# Patient Record
Sex: Male | Born: 1960 | Race: Black or African American | Hispanic: No | Marital: Single | Smoking: Current every day smoker
Health system: Southern US, Community
[De-identification: ages and names within clinical notes are randomized; demographics above are authoritative.]

## PROBLEM LIST (undated history)

## (undated) DIAGNOSIS — K3580 Unspecified acute appendicitis: Secondary | ICD-10-CM

## (undated) DIAGNOSIS — I10 Essential (primary) hypertension: Secondary | ICD-10-CM

## (undated) DIAGNOSIS — K37 Unspecified appendicitis: Secondary | ICD-10-CM

## (undated) DIAGNOSIS — Z720 Tobacco use: Secondary | ICD-10-CM

## (undated) DIAGNOSIS — K219 Gastro-esophageal reflux disease without esophagitis: Secondary | ICD-10-CM

## (undated) DIAGNOSIS — B1920 Unspecified viral hepatitis C without hepatic coma: Secondary | ICD-10-CM

## (undated) HISTORY — DX: Tobacco use: Z72.0

## (undated) HISTORY — DX: Gastro-esophageal reflux disease without esophagitis: K21.9

## (undated) HISTORY — DX: Unspecified acute appendicitis: K35.80

## (undated) HISTORY — DX: Unspecified viral hepatitis C without hepatic coma: B19.20

## (undated) HISTORY — PX: HERNIA REPAIR: SHX51

## (undated) HISTORY — DX: Essential (primary) hypertension: I10

## (undated) HISTORY — DX: Unspecified appendicitis: K37

---

## 2003-12-28 ENCOUNTER — Emergency Department: Payer: Self-pay | Admitting: Emergency Medicine

## 2003-12-28 ENCOUNTER — Other Ambulatory Visit: Payer: Self-pay

## 2004-01-02 ENCOUNTER — Emergency Department: Payer: Self-pay | Admitting: Unknown Physician Specialty

## 2005-06-29 ENCOUNTER — Emergency Department: Payer: Self-pay | Admitting: Emergency Medicine

## 2005-07-05 ENCOUNTER — Ambulatory Visit: Payer: Self-pay | Admitting: Emergency Medicine

## 2005-07-15 ENCOUNTER — Ambulatory Visit: Payer: Self-pay | Admitting: Internal Medicine

## 2006-01-12 ENCOUNTER — Other Ambulatory Visit: Payer: Self-pay

## 2006-01-12 ENCOUNTER — Emergency Department: Payer: Self-pay | Admitting: Emergency Medicine

## 2006-09-19 ENCOUNTER — Emergency Department: Payer: Self-pay | Admitting: Emergency Medicine

## 2006-10-16 ENCOUNTER — Other Ambulatory Visit: Payer: Self-pay

## 2006-10-16 ENCOUNTER — Emergency Department: Payer: Self-pay | Admitting: Emergency Medicine

## 2006-10-20 ENCOUNTER — Emergency Department: Payer: Self-pay | Admitting: Emergency Medicine

## 2006-10-21 ENCOUNTER — Other Ambulatory Visit: Payer: Self-pay

## 2006-12-19 ENCOUNTER — Other Ambulatory Visit: Payer: Self-pay

## 2006-12-19 ENCOUNTER — Emergency Department: Payer: Self-pay | Admitting: Emergency Medicine

## 2008-04-24 ENCOUNTER — Emergency Department: Payer: Self-pay | Admitting: Emergency Medicine

## 2008-07-11 ENCOUNTER — Emergency Department: Payer: Self-pay | Admitting: Emergency Medicine

## 2008-07-11 ENCOUNTER — Ambulatory Visit: Payer: Self-pay

## 2013-05-01 LAB — BASIC METABOLIC PANEL
BUN: 14 mg/dL (ref 4–21)
Creatinine: 0.9 mg/dL (ref 0.6–1.3)
Glucose: 89 mg/dL
POTASSIUM: 4 mmol/L (ref 3.4–5.3)
SODIUM: 141 mmol/L (ref 137–147)

## 2013-05-01 LAB — TSH: TSH: 1.83 u[IU]/mL (ref 0.41–5.90)

## 2013-05-01 LAB — HEPATIC FUNCTION PANEL
ALT: 23 U/L (ref 10–40)
AST: 25 U/L (ref 14–40)
Alkaline Phosphatase: 57 U/L (ref 25–125)
BILIRUBIN, TOTAL: 0.4 mg/dL

## 2013-05-01 LAB — CBC AND DIFFERENTIAL
HCT: 45 % (ref 41–53)
Hemoglobin: 15.7 g/dL (ref 13.5–17.5)
NEUTROS ABS: 2 /uL
Platelets: 240 10*3/uL (ref 150–399)
WBC: 4.2 10^3/mL

## 2013-05-01 LAB — LIPID PANEL
Cholesterol: 151 mg/dL (ref 0–200)
HDL: 41 mg/dL (ref 35–70)
LDL CALC: 92 mg/dL
TRIGLYCERIDES: 88 mg/dL (ref 40–160)

## 2013-05-01 LAB — HEMOGLOBIN A1C: HEMOGLOBIN A1C: 5.7

## 2013-07-18 ENCOUNTER — Emergency Department: Payer: Self-pay | Admitting: Emergency Medicine

## 2013-07-18 LAB — CBC
HCT: 48.8 % (ref 40.0–52.0)
HGB: 16.8 g/dL (ref 13.0–18.0)
MCH: 33.7 pg (ref 26.0–34.0)
MCHC: 34.4 g/dL (ref 32.0–36.0)
MCV: 98 fL (ref 80–100)
PLATELETS: 240 10*3/uL (ref 150–440)
RBC: 4.98 10*6/uL (ref 4.40–5.90)
RDW: 13.3 % (ref 11.5–14.5)
WBC: 5.2 10*3/uL (ref 3.8–10.6)

## 2013-07-18 LAB — COMPREHENSIVE METABOLIC PANEL
ALBUMIN: 3.8 g/dL (ref 3.4–5.0)
ANION GAP: 3 — AB (ref 7–16)
AST: 19 U/L (ref 15–37)
Alkaline Phosphatase: 64 U/L
BUN: 10 mg/dL (ref 7–18)
Bilirubin,Total: 0.6 mg/dL (ref 0.2–1.0)
CO2: 33 mmol/L — AB (ref 21–32)
CREATININE: 0.84 mg/dL (ref 0.60–1.30)
Calcium, Total: 9 mg/dL (ref 8.5–10.1)
Chloride: 102 mmol/L (ref 98–107)
EGFR (African American): >60
EGFR (Non-African Amer.): >60
Glucose: 112 mg/dL — ABNORMAL HIGH (ref 65–99)
Osmolality: 275 (ref 275–301)
Potassium: 3.5 mmol/L (ref 3.5–5.1)
SGPT (ALT): 24 U/L (ref 12–78)
Sodium: 138 mmol/L (ref 136–145)
Total Protein: 8.9 g/dL — ABNORMAL HIGH (ref 6.4–8.2)

## 2013-07-18 LAB — URINALYSIS, COMPLETE
BACTERIA: NONE SEEN
BILIRUBIN, UR: NEGATIVE
Blood: NEGATIVE
Glucose,UR: NEGATIVE mg/dL (ref 0–75)
Ketone: NEGATIVE
Leukocyte Esterase: NEGATIVE
NITRITE: NEGATIVE
PROTEIN: NEGATIVE
Ph: 5 (ref 4.5–8.0)
Specific Gravity: 1.02 (ref 1.003–1.030)
Squamous Epithelial: <1
WBC UR: <1 /HPF (ref 0–5)

## 2013-07-18 LAB — GC/CHLAMYDIA PROBE AMP

## 2014-12-20 ENCOUNTER — Emergency Department
Admission: EM | Admit: 2014-12-20 | Discharge: 2014-12-20 | Disposition: A | Discharge status: Home / Self Care | Payer: Medicaid Other | Attending: Student | Admitting: Student

## 2014-12-20 ENCOUNTER — Encounter: Payer: Self-pay | Admitting: Emergency Medicine

## 2014-12-20 DIAGNOSIS — K0253 Dental caries on pit and fissure surface penetrating into pulp: Secondary | ICD-10-CM | POA: Insufficient documentation

## 2014-12-20 DIAGNOSIS — Z720 Tobacco use: Secondary | ICD-10-CM | POA: Insufficient documentation

## 2014-12-20 DIAGNOSIS — K0381 Cracked tooth: Secondary | ICD-10-CM | POA: Diagnosis not present

## 2014-12-20 DIAGNOSIS — S025XXA Fracture of tooth (traumatic), initial encounter for closed fracture: Secondary | ICD-10-CM

## 2014-12-20 DIAGNOSIS — K029 Dental caries, unspecified: Secondary | ICD-10-CM

## 2014-12-20 DIAGNOSIS — K0889 Other specified disorders of teeth and supporting structures: Secondary | ICD-10-CM | POA: Diagnosis present

## 2014-12-20 MED ORDER — PENICILLIN V POTASSIUM 500 MG PO TABS: 500.0000 mg | ORAL_TABLET | Freq: Four times a day (QID) | ORAL | Status: DC

## 2014-12-20 NOTE — ED Provider Notes (Signed)
[facility] Emergency Department Provider Note ____________________________________________  Time seen: 1400  I have reviewed the triage vital signs and the nursing notes.  HISTORY  Chief Complaint  Dental Injury  HPI Shane Dominguez is a 54 y.o. male reports ED for evaluation and management of a acute fracture to her chronically decayed to in the maxilla. He describes yesterday, and his front right incisor broke without injury. He denies fevers, chills, or sweats. He rates his pain at 3/10.   History reviewed. No pertinent past medical history.  There are no active problems to display for this patient.  Past Surgical History  Procedure Laterality Date  . Hernia repair      Current Outpatient Rx  Name  Route  Sig  Dispense  Refill  . penicillin v potassium (VEETID) 500 MG tablet   Oral   Take 1 tablet (500 mg total) by mouth 4 (four) times daily.   40 tablet   0    Allergies Review of patient's allergies indicates no known allergies.  No family history on file.  Social History Social History  Substance Use Topics  . Smoking status: Current Every Day Smoker  . Smokeless tobacco: None  . Alcohol Use: No   Review of Systems  Constitutional: Negative for fever. Eyes: Negative for visual changes. ENT: Negative for sore throat. Dental injury as above Cardiovascular: Negative for chest pain. Respiratory: Negative for shortness of breath. Gastrointestinal: Negative for abdominal pain, vomiting and diarrhea. Genitourinary: Negative for dysuria. Musculoskeletal: Negative for back pain. Skin: Negative for rash. Neurological: Negative for headaches, focal weakness or numbness. ____________________________________________  PHYSICAL EXAM:  VITAL SIGNS: ED Triage Vitals  Enc Vitals Group     BP 12/20/14 1301 161/89 mmHg     Pulse Rate 12/20/14 1301 78     Resp 12/20/14 1301 16     Temp 12/20/14 1301 98.4 F (36.9 C)     Temp Source 12/20/14  1301 Oral     SpO2 12/20/14 1301 100 %     Weight 12/20/14 1301 145 lb (65.772 kg)     Height 12/20/14 1301 6' (1.829 m)     Head Cir --      Peak Flow --      Pain Score 12/20/14 1257 3     Pain Loc --      Pain Edu? --      Excl. in GC? --    Constitutional: Alert and oriented. Well appearing and in no distress. Head: Normocephalic and atraumatic.      Eyes: Conjunctivae are normal. PERRL. Normal extraocular movements      Ears: Canals clear. TMs intact bilaterally.   Nose: No congestion/rhinorrhea.   Mouth/Throat: Mucous membranes are moist. General dental caries. Front right incisor broken to the gum. No appreciable dental abscess.    Neck: Supple. No thyromegaly. Hematological/Lymphatic/Immunological: No cervical lymphadenopathy. Cardiovascular: Normal rate, regular rhythm.  Respiratory: Normal respiratory effort. No wheezes/rales/rhonchi. Gastrointestinal: Soft and nontender. No distention. Musculoskeletal: Nontender with normal range of motion in all extremities.  Neurologic:  Normal gait without ataxia. Normal speech and language. No gross focal neurologic deficits are appreciated. Skin:  Skin is warm, dry and intact. No rash noted. Psychiatric: Mood and affect are normal. Patient exhibits appropriate insight and judgment. 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 Patient given list of dental providers. Prescription for Pen VK for given prophylactically.  ____________________________________________  FINAL CLINICAL IMPRESSION(S) / ED DIAGNOSES  Final diagnoses:  Dental caries into pulp  Broken tooth, closed, initial encounter      Lissa HoardJenise V Bacon Wilfrid Hyser, PA-C 12/20/14 1507  Gayla DossEryka A Gayle, MD 12/20/14 1531

## 2014-12-20 NOTE — ED Notes (Signed)
Patient to ER for broken tooth. States he knows there is no dentist to see him here, but wants to be assessed so that he can get referral to dentist with Medicaid. Broken tooth is right upper front dentition.

## 2014-12-20 NOTE — Discharge Instructions (Signed)
Dental Caries Dental caries is tooth decay. This decay can cause a hole in teeth (cavity) that can get bigger and deeper over time. HOME CARE  Brush and floss your teeth. Do this at least two times a day.  Use a fluoride toothpaste.  Use a mouth rinse if told by your dentist or doctor.  Eat less sugary and starchy foods. Drink less sugary drinks.  Avoid snacking often on sugary and starchy foods. Avoid sipping often on sugary drinks.  Keep regular checkups and cleanings with your dentist.  Use fluoride supplements if told by your dentist or doctor.  Allow fluoride to be applied to teeth if told by your dentist or doctor.   This information is not intended to replace advice given to you by your health care provider. Make sure you discuss any questions you have with your health care provider.   Document Released: 12/01/2007 Document Revised: 03/14/2014 Document Reviewed: 02/24/2012 Elsevier Interactive Patient Education 2016 Elsevier Inc.  Dental Pain Dental pain may be caused by many things, including:  Tooth decay (cavities or caries). Cavities cause the nerve of your tooth to be open to air and hot or cold temperatures. This can cause pain or discomfort.  Abscess or infection. A dental abscess is an area that is full of infected pus from a bacterial infection in the inner part of the tooth (pulp). It usually happens at the end of the tooth's root.  Injury.  An unknown reason (idiopathic). Your pain may be mild or severe. It may only happen when:  You are chewing.  You are exposed to hot or cold temperature.  You are eating or drinking sugary foods or beverages, such as:  Soda.  Candy. Your pain may also be there all of the time. HOME CARE Watch your dental pain for any changes. Do these things to lessen your discomfort:  Take medicines only as told by your dentist.  If your dentist tells you to take an antibiotic medicine, finish all of it even if you start to  feel better.  Keep all follow-up visits as told by your dentist. This is important.  Do not apply heat to the outside of your face.  Rinse your mouth or gargle with salt water if told by your dentist. This helps with pain and swelling.  You can make salt water by adding  tsp of salt to 1 cup of warm water.  Apply ice to the painful area of your face:  Put ice in a plastic bag.  Place a towel between your skin and the bag.  Leave the ice on for 20 minutes, 2-3 times per day.  Avoid foods or drinks that cause you pain, such as:  Very hot or very cold foods or drinks.  Sweet or sugary foods or drinks. GET HELP IF:  Your pain is not helped with medicines.  Your symptoms are worse.  You have new symptoms. GET HELP RIGHT AWAY IF:  You cannot open your mouth.  You are having trouble breathing or swallowing.  You have a fever.  Your face, neck, or jaw is puffy (swollen).   This information is not intended to replace advice given to you by your health care provider. Make sure you discuss any questions you have with your health care provider.   Document Released: 08/10/2007 Document Revised: 07/08/2014 Document Reviewed: 02/17/2014 Elsevier Interactive Patient Education 2016 [employer]  OPTIONS FOR DENTAL FOLLOW UP CARE  [place] Department of Health and Human Services - Local  Safety Net Dental Clinics [email or web address]   [facility] [phone])  [facility] Ma [phone])  [facility] [phone])  [facility] [phone])  [facility] [phone]) This clinic caters to the indigent population and is on a lottery system. Location: [employer] [facility], [employer], [address] [place] Clinic Hours: Wednesdays from 6pm - 9pm, patients seen by a lottery system. For dates, call or go to  [email or web address] Services: Cleanings, fillings and simple extractions. Payment Options: DENTAL WORK IS FREE OF CHARGE. Bring proof of income or support. Best way to get seen: Arrive at 5:15 pm - this is a lottery, NOT first come/first serve, so arriving earlier will not increase your chances of being seen.     [facility] [phone] Select option 1 for emergencies   Location: [facility], [place], [address], [place] Clinic Hours: No walk-ins accepted - call the day before to schedule an appointment. Check in times are 9:30 am and 1:30 pm. Services: Simple extractions, temporary fillings, pulpectomy/pulp debridement, uncomplicated abscess drainage. Payment Options: PAYMENT IS DUE AT THE TIME OF SERVICE.  Fee is usually $100-200, additional surgical procedures (e.g. abscess drainage) may be extra. Cash, checks, Visa/MasterCard accepted.  Can file Medicaid if patient is covered for dental - patient should call case worker to check. No discount for [facility] patients. Best way to get seen: MUST call the day before and get onto the schedule. Can usually be seen the next 1-2 days. No walk-ins accepted.     [facility] [phone]   Location: [facility], [address], [place] Clinic Hours: M, W, Th, F 8am or 1:30pm, Tues 9a or 1:30 - first come/first served. Services: Simple extractions, temporary fillings, uncomplicated abscess drainage.  You do not need to be an [facility] resident. Payment Options: PAYMENT IS DUE AT THE TIME OF SERVICE. Dental insurance, otherwise sliding scale - bring proof of income or support. Depending on income and treatment needed, cost is usually $50-200. Best way to get seen: Arrive early as it is first come/first served.     [facility] [phone]   Location: [address] Clinic Hours: Mon-Thu 8a-5p Services: Most basic dental services including extractions and fillings. Payment Options: PAYMENT IS DUE AT THE TIME OF SERVICE. Sliding scale, up to 50% off - bring proof if income or support. Medicaid with dental option accepted. Best way to get seen: Call to schedule an appointment, can usually be seen within 2 weeks OR they will try to see walk-ins - show up at 8a or 2p (you may have to wait).     [facility] [phone] [place] RESIDENTS ONLY   Location: [facility], [address] [place], [place] [address] Clinic Hours: By appointment only. Monday - Thursday 8am-5pm, Friday 8am-12pm Services: Cleanings, fillings, extractions. Payment Options: PAYMENT IS DUE AT THE TIME OF SERVICE. Cash, Visa or MasterCard. Sliding scale - $30 minimum per service. Best way to get seen: Come in to office, complete packet and make an appointment - need proof of income or support monies for each household member and proof of [facility] residence. Usually takes about a month to get in.     [facility] [phone]   Location: [address] [facility] Clinic Hours: Walk-in Urgent Care Dental Services are offered Monday-Friday mornings only. The numbers  of emergencies accepted daily is limited to the number of providers available. Maximum 15 - Mondays, Wednesdays & Thursdays Maximum 10 - Tuesdays & Fridays Services: You do not need to be a [facility] resident to be seen for a dental emergency. Emergencies are defined as pain, swelling, abnormal bleeding, or dental trauma. Walkins will receive x-rays if needed. NOTE: Dental cleaning is not an emergency. Payment Options: PAYMENT IS DUE AT THE TIME OF SERVICE. Minimum co-pay is $40.00 for uninsured patients. Minimum co-pay is $3.00 for Medicaid with dental coverage. Dental Insurance is accepted and must be presented at time of  visit. Medicare does not cover dental. Forms of payment: Cash, credit card, checks. Best way to get seen: If not previously registered with the clinic, walk-in dental registration begins at 7:15 am and is on a first come/first serve basis. If previously registered with the clinic, call to make an appointment.     [facility] [phone] [place] RESIDENTS ONLY   Location: [address] [place], [place] Clinic Hours: Mon-Thu 10a-2p Services: Extractions only! Payment Options: FREE (donations accepted) - bring proof of income or support Best way to get seen: Call and schedule an appointment OR come at 8am on the 1st Monday of every month (except for holidays) when it is first come/first served.     [facility] [phone]   Location: [address] [place], [place] Clinic Hours: Friday mornings Services, Payment Options, Best way to get seen: Call for info

## 2014-12-20 NOTE — ED Notes (Signed)
NAD noted at time of D/C. Pt denies questions or concerns. Pt ambulatory to the lobby at this time.  

## 2016-08-18 ENCOUNTER — Encounter
Admission: EM | Disposition: A | Discharge status: Home / Self Care | Payer: Self-pay | Source: Home / Self Care | Attending: Emergency Medicine

## 2016-08-18 ENCOUNTER — Emergency Department: Payer: Medicaid Other | Admitting: Anesthesiology

## 2016-08-18 ENCOUNTER — Observation Stay
Admission: EM | Admit: 2016-08-18 | Discharge: 2016-08-19 | Disposition: A | Discharge status: Home / Self Care | Payer: Medicaid Other | Attending: Surgery | Admitting: Surgery

## 2016-08-18 ENCOUNTER — Encounter: Payer: Self-pay | Admitting: Emergency Medicine

## 2016-08-18 ENCOUNTER — Emergency Department: Payer: Medicaid Other

## 2016-08-18 DIAGNOSIS — K4031 Unilateral inguinal hernia, with obstruction, without gangrene, recurrent: Secondary | ICD-10-CM | POA: Insufficient documentation

## 2016-08-18 DIAGNOSIS — I10 Essential (primary) hypertension: Secondary | ICD-10-CM | POA: Insufficient documentation

## 2016-08-18 DIAGNOSIS — K37 Unspecified appendicitis: Secondary | ICD-10-CM

## 2016-08-18 DIAGNOSIS — K3580 Unspecified acute appendicitis: Secondary | ICD-10-CM

## 2016-08-18 DIAGNOSIS — B1920 Unspecified viral hepatitis C without hepatic coma: Secondary | ICD-10-CM | POA: Diagnosis not present

## 2016-08-18 DIAGNOSIS — K219 Gastro-esophageal reflux disease without esophagitis: Secondary | ICD-10-CM | POA: Insufficient documentation

## 2016-08-18 DIAGNOSIS — F17200 Nicotine dependence, unspecified, uncomplicated: Secondary | ICD-10-CM | POA: Insufficient documentation

## 2016-08-18 DIAGNOSIS — L02214 Cutaneous abscess of groin: Secondary | ICD-10-CM | POA: Diagnosis not present

## 2016-08-18 DIAGNOSIS — K4090 Unilateral inguinal hernia, without obstruction or gangrene, not specified as recurrent: Secondary | ICD-10-CM | POA: Diagnosis not present

## 2016-08-18 DIAGNOSIS — R109 Unspecified abdominal pain: Secondary | ICD-10-CM | POA: Diagnosis present

## 2016-08-18 DIAGNOSIS — K56609 Unspecified intestinal obstruction, unspecified as to partial versus complete obstruction: Secondary | ICD-10-CM

## 2016-08-18 HISTORY — PX: LAPAROSCOPIC APPENDECTOMY: SHX408

## 2016-08-18 HISTORY — DX: Unspecified appendicitis: K37

## 2016-08-18 LAB — URINALYSIS, COMPLETE (UACMP) WITH MICROSCOPIC
BILIRUBIN URINE: NEGATIVE
Bacteria, UA: NONE SEEN
GLUCOSE, UA: NEGATIVE mg/dL
HGB URINE DIPSTICK: NEGATIVE
KETONES UR: NEGATIVE mg/dL
LEUKOCYTES UA: NEGATIVE
NITRITE: NEGATIVE
PH: 5 (ref 5.0–8.0)
Protein, ur: NEGATIVE mg/dL
RBC / HPF: NONE SEEN RBC/hpf (ref 0–5)
SPECIFIC GRAVITY, URINE: 1.027 (ref 1.005–1.030)

## 2016-08-18 LAB — PROTIME-INR
INR: 1.16
Prothrombin Time: 14.9 seconds (ref 11.4–15.2)

## 2016-08-18 LAB — COMPREHENSIVE METABOLIC PANEL
ALT: 22 U/L (ref 17–63)
AST: 28 U/L (ref 15–41)
Albumin: 4.1 g/dL (ref 3.5–5.0)
Alkaline Phosphatase: 53 U/L (ref 38–126)
Anion gap: 5 (ref 5–15)
BILIRUBIN TOTAL: 1.1 mg/dL (ref 0.3–1.2)
BUN: 17 mg/dL (ref 6–20)
CHLORIDE: 99 mmol/L — AB (ref 101–111)
CO2: 30 mmol/L (ref 22–32)
CREATININE: 0.77 mg/dL (ref 0.61–1.24)
Calcium: 9.2 mg/dL (ref 8.9–10.3)
Glucose, Bld: 100 mg/dL — ABNORMAL HIGH (ref 65–99)
POTASSIUM: 4.8 mmol/L (ref 3.5–5.1)
Sodium: 134 mmol/L — ABNORMAL LOW (ref 135–145)
TOTAL PROTEIN: 8.4 g/dL — AB (ref 6.5–8.1)

## 2016-08-18 LAB — CBC
HCT: 49 % (ref 40.0–52.0)
Hemoglobin: 16.8 g/dL (ref 13.0–18.0)
MCH: 32.8 pg (ref 26.0–34.0)
MCHC: 34.2 g/dL (ref 32.0–36.0)
MCV: 95.8 fL (ref 80.0–100.0)
PLATELETS: 227 10*3/uL (ref 150–440)
RBC: 5.12 MIL/uL (ref 4.40–5.90)
RDW: 13.3 % (ref 11.5–14.5)
WBC: 11.9 10*3/uL — AB (ref 3.8–10.6)

## 2016-08-18 LAB — LIPASE, BLOOD: LIPASE: 67 U/L — AB (ref 11–51)

## 2016-08-18 LAB — APTT: aPTT: 35 seconds (ref 24–36)

## 2016-08-18 SURGERY — APPENDECTOMY, LAPAROSCOPIC
Anesthesia: General | Site: Abdomen | Wound class: Clean Contaminated

## 2016-08-18 MED ORDER — DEXAMETHASONE SODIUM PHOSPHATE 10 MG/ML IJ SOLN
INTRAMUSCULAR | Status: AC
  Filled 2016-08-18: qty 1

## 2016-08-18 MED ORDER — LACTATED RINGERS IV SOLN
INTRAVENOUS | Status: DC | PRN
  Administered 2016-08-18: 21:00:00 via INTRAVENOUS

## 2016-08-18 MED ORDER — SODIUM CHLORIDE 0.9 % IV SOLN
INTRAVENOUS | Status: DC | PRN
  Administered 2016-08-18: 20:00:00 via INTRAVENOUS

## 2016-08-18 MED ORDER — OXYCODONE HCL 5 MG PO TABS: 5.0000 mg | ORAL_TABLET | ORAL | Status: DC | PRN

## 2016-08-18 MED ORDER — PIPERACILLIN-TAZOBACTAM 3.375 G IVPB
3.3750 g | Freq: Three times a day (TID) | INTRAVENOUS | Status: AC
  Administered 2016-08-19 (×2): 3.375 g via INTRAVENOUS
  Filled 2016-08-18 (×2): qty 50

## 2016-08-18 MED ORDER — MIDAZOLAM HCL 2 MG/2ML IJ SOLN
INTRAMUSCULAR | Status: DC | PRN
  Administered 2016-08-18: 2 mg via INTRAVENOUS

## 2016-08-18 MED ORDER — PIPERACILLIN-TAZOBACTAM 3.375 G IVPB 30 MIN
3.3750 g | Freq: Once | INTRAVENOUS | Status: AC
  Administered 2016-08-18: 3.375 g via INTRAVENOUS

## 2016-08-18 MED ORDER — ROCURONIUM BROMIDE 100 MG/10ML IV SOLN
INTRAVENOUS | Status: DC | PRN
  Administered 2016-08-18: 10 mg via INTRAVENOUS
  Administered 2016-08-18: 30 mg via INTRAVENOUS

## 2016-08-18 MED ORDER — FENTANYL CITRATE (PF) 100 MCG/2ML IJ SOLN: 25.0000 ug | INTRAMUSCULAR | Status: DC | PRN

## 2016-08-18 MED ORDER — LIDOCAINE HCL (CARDIAC) 20 MG/ML IV SOLN
INTRAVENOUS | Status: DC | PRN
  Administered 2016-08-18: 80 mg via INTRAVENOUS

## 2016-08-18 MED ORDER — PANTOPRAZOLE SODIUM 40 MG IV SOLR
40.0000 mg | Freq: Every day | INTRAVENOUS | Status: DC
  Administered 2016-08-18: 40 mg via INTRAVENOUS
  Filled 2016-08-18: qty 40

## 2016-08-18 MED ORDER — MORPHINE SULFATE (PF) 4 MG/ML IV SOLN: 2.0000 mg | INTRAVENOUS | Status: DC | PRN

## 2016-08-18 MED ORDER — PROMETHAZINE HCL 25 MG/ML IJ SOLN: 6.2500 mg | INTRAMUSCULAR | Status: DC | PRN

## 2016-08-18 MED ORDER — BUPIVACAINE-EPINEPHRINE 0.25% -1:200000 IJ SOLN
INTRAMUSCULAR | Status: DC | PRN
Start: 2016-08-18 — End: 2016-08-18
  Administered 2016-08-18: 30 mL

## 2016-08-18 MED ORDER — SUCCINYLCHOLINE CHLORIDE 20 MG/ML IJ SOLN
INTRAMUSCULAR | Status: DC | PRN
  Administered 2016-08-18: 80 mg via INTRAVENOUS

## 2016-08-18 MED ORDER — LIDOCAINE HCL (PF) 2 % IJ SOLN
INTRAMUSCULAR | Status: AC
  Filled 2016-08-18: qty 2

## 2016-08-18 MED ORDER — PROPOFOL 10 MG/ML IV BOLUS
INTRAVENOUS | Status: DC | PRN
  Administered 2016-08-18: 200 mg via INTRAVENOUS

## 2016-08-18 MED ORDER — KETOROLAC TROMETHAMINE 30 MG/ML IJ SOLN
INTRAMUSCULAR | Status: AC
  Filled 2016-08-18: qty 1

## 2016-08-18 MED ORDER — ONDANSETRON HCL 4 MG/2ML IJ SOLN
4.0000 mg | Freq: Four times a day (QID) | INTRAMUSCULAR | Status: DC | PRN
Start: 2016-08-18 — End: 2016-08-19

## 2016-08-18 MED ORDER — ONDANSETRON HCL 4 MG/2ML IJ SOLN
INTRAMUSCULAR | Status: AC
  Filled 2016-08-18: qty 2

## 2016-08-18 MED ORDER — SODIUM CHLORIDE 0.9 % IV BOLUS (SEPSIS)
1000.0000 mL | Freq: Once | INTRAVENOUS | Status: AC
  Administered 2016-08-18: 1000 mL via INTRAVENOUS

## 2016-08-18 MED ORDER — SUCCINYLCHOLINE CHLORIDE 20 MG/ML IJ SOLN
INTRAMUSCULAR | Status: AC
  Filled 2016-08-18: qty 1

## 2016-08-18 MED ORDER — KETOROLAC TROMETHAMINE 30 MG/ML IJ SOLN
30.0000 mg | Freq: Four times a day (QID) | INTRAMUSCULAR | Status: DC
  Administered 2016-08-18 – 2016-08-19 (×3): 30 mg via INTRAVENOUS
  Filled 2016-08-18 (×3): qty 1

## 2016-08-18 MED ORDER — DEXAMETHASONE SODIUM PHOSPHATE 10 MG/ML IJ SOLN
INTRAMUSCULAR | Status: DC | PRN
  Administered 2016-08-18: 5 mg via INTRAVENOUS

## 2016-08-18 MED ORDER — PROPOFOL 10 MG/ML IV BOLUS
INTRAVENOUS | Status: AC
  Filled 2016-08-18: qty 20

## 2016-08-18 MED ORDER — ACETAMINOPHEN 10 MG/ML IV SOLN
INTRAVENOUS | Status: DC | PRN
  Administered 2016-08-18: 1000 mg via INTRAVENOUS

## 2016-08-18 MED ORDER — MIDAZOLAM HCL 2 MG/2ML IJ SOLN
INTRAMUSCULAR | Status: AC
  Filled 2016-08-18: qty 2

## 2016-08-18 MED ORDER — LACTATED RINGERS IV SOLN
INTRAVENOUS | Status: DC
  Administered 2016-08-18 – 2016-08-19 (×3): via INTRAVENOUS

## 2016-08-18 MED ORDER — ENOXAPARIN SODIUM 40 MG/0.4ML ~~LOC~~ SOLN: 40.0000 mg | SUBCUTANEOUS | Status: DC

## 2016-08-18 MED ORDER — IOPAMIDOL (ISOVUE-300) INJECTION 61%
30.0000 mL | Freq: Once | INTRAVENOUS | Status: AC
  Administered 2016-08-18: 30 mL via ORAL
  Filled 2016-08-18: qty 30

## 2016-08-18 MED ORDER — FENTANYL CITRATE (PF) 100 MCG/2ML IJ SOLN
INTRAMUSCULAR | Status: DC | PRN
  Administered 2016-08-18 (×2): 50 ug via INTRAVENOUS

## 2016-08-18 MED ORDER — IOPAMIDOL (ISOVUE-300) INJECTION 61%
100.0000 mL | Freq: Once | INTRAVENOUS | Status: AC | PRN
  Administered 2016-08-18: 100 mL via INTRAVENOUS
  Filled 2016-08-18: qty 100

## 2016-08-18 MED ORDER — ACETAMINOPHEN 500 MG PO TABS
1000.0000 mg | ORAL_TABLET | Freq: Four times a day (QID) | ORAL | Status: DC
  Administered 2016-08-18 – 2016-08-19 (×3): 1000 mg via ORAL
  Filled 2016-08-18 (×3): qty 2

## 2016-08-18 MED ORDER — BUPIVACAINE-EPINEPHRINE (PF) 0.25% -1:200000 IJ SOLN
INTRAMUSCULAR | Status: AC
  Filled 2016-08-18: qty 30

## 2016-08-18 MED ORDER — KETOROLAC TROMETHAMINE 30 MG/ML IJ SOLN
INTRAMUSCULAR | Status: DC | PRN
  Administered 2016-08-18: 30 mg via INTRAVENOUS

## 2016-08-18 MED ORDER — PIPERACILLIN-TAZOBACTAM 3.375 G IVPB
INTRAVENOUS | Status: AC
  Administered 2016-08-18: 3.375 g via INTRAVENOUS
  Filled 2016-08-18: qty 50

## 2016-08-18 MED ORDER — FENTANYL CITRATE (PF) 100 MCG/2ML IJ SOLN
INTRAMUSCULAR | Status: AC
  Filled 2016-08-18: qty 2

## 2016-08-18 MED ORDER — ONDANSETRON HCL 4 MG/2ML IJ SOLN
INTRAMUSCULAR | Status: DC | PRN
  Administered 2016-08-18: 4 mg via INTRAVENOUS

## 2016-08-18 MED ORDER — METHOCARBAMOL 500 MG PO TABS
500.0000 mg | ORAL_TABLET | Freq: Four times a day (QID) | ORAL | Status: DC | PRN
  Filled 2016-08-18: qty 1

## 2016-08-18 MED ORDER — FENTANYL CITRATE (PF) 100 MCG/2ML IJ SOLN
75.0000 ug | Freq: Once | INTRAMUSCULAR | Status: AC
  Administered 2016-08-18: 75 ug via INTRAVENOUS
  Filled 2016-08-18: qty 2

## 2016-08-18 MED ORDER — SUGAMMADEX SODIUM 200 MG/2ML IV SOLN
INTRAVENOUS | Status: AC
  Filled 2016-08-18: qty 2

## 2016-08-18 MED ORDER — ONDANSETRON 4 MG PO TBDP: 4.0000 mg | ORAL_TABLET | Freq: Four times a day (QID) | ORAL | Status: DC | PRN

## 2016-08-18 MED ORDER — ACETAMINOPHEN 10 MG/ML IV SOLN
INTRAVENOUS | Status: AC
  Filled 2016-08-18: qty 100

## 2016-08-18 MED ORDER — SUGAMMADEX SODIUM 200 MG/2ML IV SOLN
INTRAVENOUS | Status: DC | PRN
  Administered 2016-08-18: 130 mg via INTRAVENOUS

## 2016-08-18 MED ORDER — ROCURONIUM BROMIDE 50 MG/5ML IV SOLN
INTRAVENOUS | Status: AC
  Filled 2016-08-18: qty 1

## 2016-08-18 MED ORDER — HYDRALAZINE HCL 20 MG/ML IJ SOLN: 10.0000 mg | INTRAMUSCULAR | Status: DC | PRN

## 2016-08-18 SURGICAL SUPPLY — 36 items
APPLIER CLIP 5 13 M/L LIGAMAX5 (MISCELLANEOUS)
BLADE CLIPPER SURG (BLADE) ×3 IMPLANT
CANISTER SUCT 1200ML W/VALVE (MISCELLANEOUS) ×3 IMPLANT
CHLORAPREP W/TINT 26ML (MISCELLANEOUS) ×3 IMPLANT
CLIP APPLIE 5 13 M/L LIGAMAX5 (MISCELLANEOUS) IMPLANT
CUTTER FLEX LINEAR 45M (STAPLE) ×3 IMPLANT
DERMABOND ADVANCED (GAUZE/BANDAGES/DRESSINGS) ×2
DERMABOND ADVANCED .7 DNX12 (GAUZE/BANDAGES/DRESSINGS) ×1 IMPLANT
ELECT CAUTERY BLADE 6.4 (BLADE) ×3 IMPLANT
ELECT REM PT RETURN 9FT ADLT (ELECTROSURGICAL) ×3
ELECTRODE REM PT RTRN 9FT ADLT (ELECTROSURGICAL) ×1 IMPLANT
ENDOPOUCH RETRIEVER 10 (MISCELLANEOUS) ×3 IMPLANT
GLOVE BIO SURGEON STRL SZ7 (GLOVE) ×12 IMPLANT
GOWN STRL REUS W/ TWL LRG LVL3 (GOWN DISPOSABLE) ×2 IMPLANT
GOWN STRL REUS W/TWL LRG LVL3 (GOWN DISPOSABLE) ×4
IRRIGATION STRYKERFLOW (MISCELLANEOUS) ×1 IMPLANT
IRRIGATOR STRYKERFLOW (MISCELLANEOUS) ×3
IV NS 1000ML (IV SOLUTION) ×2
IV NS 1000ML BAXH (IV SOLUTION) ×1 IMPLANT
NEEDLE HYPO 22GX1.5 SAFETY (NEEDLE) ×3 IMPLANT
NS IRRIG 500ML POUR BTL (IV SOLUTION) ×3 IMPLANT
PACK LAP CHOLECYSTECTOMY (MISCELLANEOUS) ×3 IMPLANT
PENCIL ELECTRO HAND CTR (MISCELLANEOUS) ×3 IMPLANT
RELOAD 45 VASCULAR/THIN (ENDOMECHANICALS) ×3 IMPLANT
RELOAD STAPLE TA45 3.5 REG BLU (ENDOMECHANICALS) ×3 IMPLANT
SCALPEL HARMONIC ACE (MISCELLANEOUS) ×3 IMPLANT
SCISSORS METZENBAUM CVD 33 (INSTRUMENTS) ×3 IMPLANT
SLEEVE ENDOPATH XCEL 5M (ENDOMECHANICALS) ×3 IMPLANT
SPONGE LAP 18X18 5 PK (GAUZE/BANDAGES/DRESSINGS) ×3 IMPLANT
SUT MNCRL AB 4-0 PS2 18 (SUTURE) ×3 IMPLANT
SUT VICRYL 0 AB UR-6 (SUTURE) ×6 IMPLANT
SYR 20CC LL (SYRINGE) ×3 IMPLANT
TRAY FOLEY W/METER SILVER 16FR (SET/KITS/TRAYS/PACK) ×3 IMPLANT
TROCAR XCEL BLUNT TIP 100MML (ENDOMECHANICALS) ×3 IMPLANT
TROCAR XCEL NON-BLD 5MMX100MML (ENDOMECHANICALS) ×6 IMPLANT
TUBING INSUF HEATED (TUBING) ×3 IMPLANT

## 2016-08-18 NOTE — Anesthesia Procedure Notes (Signed)
Procedure Name: Intubation Date/Time: 08/18/2016 8:25 PM Performed by: Aline Brochure Pre-anesthesia Checklist: Patient identified, Emergency Drugs available, Suction available and Patient being monitored Patient Re-evaluated:Patient Re-evaluated prior to inductionOxygen Delivery Method: Circle system utilized Preoxygenation: Pre-oxygenation with 100% oxygen Intubation Type: IV induction, Rapid sequence and Cricoid Pressure applied Laryngoscope Size: Mac and 4 Grade View: Grade II Tube type: Oral Tube size: 7.5 mm Number of attempts: 1 Airway Equipment and Method: Stylet Placement Confirmation: positive ETCO2,  breath sounds checked- equal and bilateral and ETT inserted through vocal cords under direct vision Secured at: 24 cm Tube secured with: Tape Dental Injury: Teeth and Oropharynx as per pre-operative assessment  Difficulty Due To: Difficult Airway- due to anterior larynx

## 2016-08-18 NOTE — H&P (Signed)
Patient ID: SIRR KABEL, male   DOB: Dec 07, 1960, 56 y.o.   MRN: [id number]  HPI Shane Dominguez is a 56 y.o. male sent to the emergency room with a two-day history of abdominal pain patient reports the pain is moderate in intensity initially in the periumbilical area and now located more towards the right lower quadrant. Pain is sharp and there is no specific out of eating or aggravating factors. He does have some decreased appetite and some nausea. No emesis and he is passing gas. He does report some fevers and chills. Of note the patient had a chronic history of bilateral inguinal hernias and never had any operation for his inguinal hernias. Further workup included a CT scan of the abdomen and pelvis that I have personally reviewed. There is evidence of dilated appendix with periappendiceal inflammation no abscesses no free air consistent with acute appendicitis. There is also evidence of bilateral inguinal hernia on the left there is sigmoid within the sac and on the right there is a fluid collection potentially some small bowel versus some hydrocele. Please note that there is a report from 2015 and also mentions this fluid collection in the right side. The current rate on the radiologist suggests an abscess but clinically there is no evidence of such. His white count is slightly elevated and his creatinine is normal. He is able to perform more than 4 Mets without any shortness of breath or chest pain. He does smoke a pack a day  HPI  Past Medical History:  Diagnosis Date  . GERD (gastroesophageal reflux disease)   . Hepatitis C   . Hypertension     Past Surgical History:  Procedure Laterality Date  . HERNIA REPAIR      No family history on file.  Social History Social History  Substance Use Topics  . Smoking status: Current Every Day Smoker  . Smokeless tobacco: Never Used  . Alcohol use No    No Known Allergies  Current Facility-Administered Medications  Medication Dose Route  Frequency Provider Last Rate Last Dose  . piperacillin-tazobactam (ZOSYN) IVPB 3.375 g  3.375 g Intravenous Once Rockne Menghini, MD       Current Outpatient Prescriptions  Medication Sig Dispense Refill  . Multiple Vitamin (MULTIVITAMIN) tablet Take 1 tablet by mouth daily.       Review of Systems Full ROS  was asked and was negative except for the information on the HPI  Physical Exam Blood pressure (!) 152/86, pulse 85, temperature 98.2 F (36.8 C), temperature source Oral, resp. rate 16, height 5\' 8"  (1.727 m), weight 63.5 kg (140 lb), SpO2 100 %. CONSTITUTIONAL: Non toxic EYES: Pupils are equal, round, and reactive to light, Sclera are non-icteric. EARS, NOSE, MOUTH AND THROAT: The oropharynx is clear. The oral mucosa is pink and moist. Hearing is intact to voice. LYMPH NODES:  Lymph nodes in the neck are normal. RESPIRATORY:  Lungs are clear. There is normal respiratory effort, with equal breath sounds bilaterally, and without pathologic use of accessory muscles. CARDIOVASCULAR: Heart is regular without murmurs, gallops, or rubs. GI: The abdomen is soft,Tender to palpation in her right lower quadrant, positive McBurney sign. Some voluntary guarding. Reducible bilateral no hernias there are mildly tender to palpation.  GU: Rectal deferred.   MUSCULOSKELETAL: Normal muscle strength and tone. No cyanosis or edema.   SKIN: Turgor is good and there are no pathologic skin lesions or ulcers. NEUROLOGIC: Motor and sensation is grossly normal. Cranial nerves are grossly intact.  PSYCH:  Oriented to person, place and time. Affect is normal.  Data Reviewed I have personally reviewed the patient's imaging, laboratory findings and medical records.    Assessment/Plan   56 year old male with signs and symptoms consistent with acute appendicitis confirmed by CT scan. Patient also has a asked to see an ileus. He does have currently bilateral inguinal hernias at this point I do not think  that the culprit of his pain. Discussed with the patient in detail about my thought process and about CT scan findings. I do recommend proceeding to the operating room for diagnostic laparoscopy and appendectomy. I do not think that we shouldn't do anything to hernias or less there is some unusual intraoperative findings that mandate repair or resection at the time of surgery. Discussed with the patient in detail about my thought process. The risks, benefits, complications, treatment options, and expected outcomes were discussed with the patient. Discussed continuing to the operating room for Laparoscopic Appendectomy.  The possibilities of  bleeding, recurrent infection, perforation of viscus, finding a normal appendix, the need for additional procedures, failure to diagnose a condition, conversion to open procedure and creating a complication requiring transfusion or further operations were discussed. The patient was given the opportunity to ask questions and have them answered.  Patient would like to proceed with Laparoscopic Appendectomy and consent was obtained. We'll start broad-spectrum antibiotics and a liter bolus of crystalloid.    Sterling Bigiego Brendy Ficek, MD FACS General Surgeon 08/18/2016, 7:44 PM

## 2016-08-18 NOTE — Transfer of Care (Signed)
Immediate Anesthesia Transfer of Care Note  Patient: Shane Dominguez  Procedure(s) Performed: Procedure(s): APPENDECTOMY LAPAROSCOPIC (N/A)  Patient Location: PACU  Anesthesia Type:General  Level of Consciousness: awake, alert  and oriented  Airway & Oxygen Therapy: Patient connected to face mask oxygen  Post-op Assessment: Post -op Vital signs reviewed and stable  Post vital signs: stable  Last Vitals:  Vitals:   08/18/16 1704 08/18/16 2120  BP: (!) 152/86 (!) 165/90  Pulse: 85 (!) 106  Resp: 16 20  Temp: 36.8 C 37.4 C    Last Pain:  Vitals:   08/18/16 2120  TempSrc:   PainSc: 0-No pain         Complications: No apparent anesthesia complications

## 2016-08-18 NOTE — ED Provider Notes (Signed)
[facility] Emergency Department Provider Note  ____________________________________________  Time seen: Approximately 5:40 PM  I have reviewed the triage vital signs and the nursing notes.   HISTORY  Chief Complaint Abdominal Pain    HPI Shane Dominguez is a 56 y.o. male with a history of hepatitis C, remote hernia repair, presenting with diffuse abdominal pain and fever. The patient reports that over the last 3-4 days, he has been doing a lot of heavy lifting and has noticed that he is sore in his abdominal muscles. The pain is "all over" but occasionally he has associated sharp pains on the right side. This is worse when he contracts his abdominal muscles, or when he pushes on them. However, the patient has also had fever to 102.0 as well as thick stool "like mud." No dysuria. The patient has not tried any medications for pain.   Past Medical History:  Diagnosis Date  . GERD (gastroesophageal reflux disease)   . Hepatitis C   . Hypertension     There are no active problems to display for this patient.   Past Surgical History:  Procedure Laterality Date  . HERNIA REPAIR      Current Outpatient Rx  . Order #: [id number] Class: Print    Allergies Patient has no known allergies.  No family history on file.  Social History Social History  Substance Use Topics  . Smoking status: Current Every Day Smoker  . Smokeless tobacco: Never Used  . Alcohol use No    Review of Systems Constitutional: Positive fever to 102.0. Significant exertion caring heavy objects. Eyes: No visual changes. ENT: No sore throat. No congestion or rhinorrhea. Cardiovascular: Denies chest pain. Denies palpitations. Respiratory: Denies shortness of breath.  No cough. Gastrointestinal: Positive diffuse abdominal pain.  No nausea, no vomiting.  No diarrhea.  No constipation. Thick stool "like mud." Genitourinary: Negative for dysuria. Musculoskeletal: Negative for back  pain. Skin: Negative for rash. Neurological: Negative for headaches. No focal numbness, tingling or weakness.     ____________________________________________   PHYSICAL EXAM:  VITAL SIGNS: ED Triage Vitals  Enc Vitals Group     BP 08/18/16 1704 (!) 152/86     Pulse Rate 08/18/16 1704 85     Resp 08/18/16 1704 16     Temp 08/18/16 1704 98.2 F (36.8 C)     Temp Source 08/18/16 1704 Oral     SpO2 08/18/16 1704 100 %     Weight 08/18/16 1704 140 lb (63.5 kg)     Height 08/18/16 1704 5\' 8"  (1.727 m)     Head Circumference --      Peak Flow --      Pain Score 08/18/16 1703 10     Pain Loc --      Pain Edu? --      Excl. in GC? --     Constitutional: Alert and oriented. Well appearing and in no acute distress. Answers questions appropriately. Eyes: Conjunctivae are normal.  EOMI. No scleral icterus. Head: Atraumatic. Nose: No congestion/rhinnorhea. Mouth/Throat: Mucous membranes are moist.  Neck: No stridor.  Supple.  No JVD. No meningismus. Cardiovascular: Normal rate, regular rhythm. No murmurs, rubs or gallops.  Respiratory: Normal respiratory effort.  No accessory muscle use or retractions. Lungs CTAB.  No wheezes, rales or ronchi. Gastrointestinal: Soft, and nondistended.  Tender to palpation in all quadrants, possibly worse in the left lower quadrant but difficult to say. No guarding or rebound.  No peritoneal signs. Musculoskeletal: No LE  edema. No ttp in the calves or palpable cords.  Negative Homan's sign. Neurologic:  A&Ox3.  Speech is clear.  Face and smile are symmetric.  EOMI.  Moves all extremities well. Skin:  Skin is warm, dry and intact. No rash noted. Psychiatric: Mood and affect are normal. Speech and behavior are normal.  Normal judgement.  ____________________________________________   LABS (all labs ordered are listed, but only abnormal results are displayed)  Labs Reviewed  LIPASE, BLOOD - Abnormal; Notable for the following:       Result Value    Lipase 67 (*)    All other components within normal limits  COMPREHENSIVE METABOLIC PANEL - Abnormal; Notable for the following:    Sodium 134 (*)    Chloride 99 (*)    Glucose, Bld 100 (*)    Total Protein 8.4 (*)    All other components within normal limits  CBC - Abnormal; Notable for the following:    WBC 11.9 (*)    All other components within normal limits  URINALYSIS, COMPLETE (UACMP) WITH MICROSCOPIC - Abnormal; Notable for the following:    Color, Urine AMBER (*)    APPearance CLEAR (*)    Squamous Epithelial / LPF 0-5 (*)    All other components within normal limits   ____________________________________________  EKG  ED ECG REPORT I, Rockne MenghiniNorman, Anne-Caroline, the attending physician, personally viewed and interpreted this ECG.   Date: 08/18/2016  EKG Time: 1808  Rate: 75  Rhythm: normal sinus rhythm  Axis: leftward  Intervals:none  ST&T Change: No STEMI  ____________________________________________  RADIOLOGY  Dg Chest 2 View  Result Date: 08/18/2016 CLINICAL DATA:  Fever, tobacco use. EXAM: CHEST  2 VIEW COMPARISON:  04/24/2008 FINDINGS: The heart size and mediastinal contours are within normal limits. There is aortic atherosclerosis at the arch. No aneurysm. The lungs are slightly hyperinflated without pneumonic consolidation, pneumothorax or effusion. No dominant mass is identified. The visualized skeletal structures are unremarkable. IMPRESSION: Aortic atherosclerosis. Hyperinflated lungs. No acute cardiopulmonary disease. Electronically Signed   By: Tollie Ethavid  Kwon M.D.   On: 08/18/2016 18:10   Ct Abdomen Pelvis W Contrast  Result Date: 08/18/2016 CLINICAL DATA:  Diffuse abdominal pain for the past few days. Recently lifted heavy appliances. EXAM: CT ABDOMEN AND PELVIS WITH CONTRAST TECHNIQUE: Multidetector CT imaging of the abdomen and pelvis was performed using the standard protocol following bolus administration of intravenous contrast. CONTRAST:  100mL  ISOVUE-300 IOPAMIDOL (ISOVUE-300) INJECTION 61% COMPARISON:  07/18/2013. FINDINGS: Lower chest: Unremarkable. Hepatobiliary: No focal liver abnormality is seen. No gallstones, gallbladder wall thickening, or biliary dilatation. Pancreas: Unremarkable. No pancreatic ductal dilatation or surrounding inflammatory changes. Spleen: Normal in size without focal abnormality. Adrenals/Urinary Tract: Adrenal glands are unremarkable. Kidneys are normal, without renal calculi, focal lesion, or hydronephrosis. Bladder is unremarkable. Stomach/Bowel: Increased size of the previously demonstrated left inguinal hernia containing fat with interval herniation of sigmoid colon into the hernia. No colonic wall thickening or dilatation. Interval enlargement of the appendix containing fluid with interval diffuse wall thickening and enhancement and periappendiceal soft tissue stranding. The appendix measures 11.2 mm in diameter. Interval dilatation of the stomach and some of the small bowel loops, including the terminal ileum. There is also interval wall thickening and enhancement with luminal narrowing involving the distal ileum adjacent to the appendix. Vascular/Lymphatic: Dense atheromatous arterial calcification its without aneurysm. No enlarged lymph nodes. Reproductive: Mildly to moderately enlarged prostate gland. Other: Right inguinal hernia containing herniated fat with interval edema. Previous demonstrated fluid  within the hernia sac has developed an interval mildly thickened wall with enhancement. This fluid measures 2.3 cm in length on sagittal image number 48 and 1.5 x 1.4 cm on axial image number 71 of series 2. Musculoskeletal: Lumbar and lower thoracic spine degenerative changes. Straightening of the normal lumbar lordosis. IMPRESSION: 1. Acute appendicitis without abscess. 2. Right inguinal hernia containing herniated fat with interval inflammation in the fat as well as a 2.3 x 1.5 x 1.4 cm probable abscess in the  herniated fat within the inguinal canal. 3. Interval herniation of sigmoid colon into the previously demonstrated left inguinal hernia without colon obstruction. 4. Interval partial obstruction of the distal small bowel due to secondary inflammatory changes involving the distal ileum causing luminal narrowing. These results were called by telephone at the time of interpretation on 08/18/2016 at 7:08 pm to Dr. Rockne Menghini , who verbally acknowledged these results. Electronically Signed   By: Beckie Salts M.D.   On: 08/18/2016 19:11    ____________________________________________   PROCEDURES  Procedure(s) performed: None  Procedures  Critical Care performed: No ____________________________________________   INITIAL IMPRESSION / ASSESSMENT AND PLAN / ED COURSE  Pertinent labs & imaging results that were available during my care of the patient were reviewed by me and considered in my medical decision making (see chart for details).  56 y.o. male with 3-4 days of increased exertion with abdominal pain, but also thick stool and fever to 102.0. On my examination, the patient has diffuse pain, and his history would be consistent with musculoskeletal strain, but given his fever and tenderness in the left lower quadrant, with a stool changes, we'll get a CT scan to evaluate for diverticulitis or other acute intra-abdominal infection. In the meantime, I'll initiate symptomatic treatment and reevaluate the patient for final disposition.  7:17 PM The pt has CT abd showing acute appy, R inguinal hernia abscess, sigmoid colon in the left inguinal canal, SBO.  Dr. Everlene Farrier has been called.  Zosyn has been ordered.  ____________________________________________  FINAL CLINICAL IMPRESSION(S) / ED DIAGNOSES  Final diagnoses:  Acute appendicitis, unspecified acute appendicitis type  Right inguinal hernia  Left inguinal hernia  Inguinal abscess         NEW MEDICATIONS STARTED DURING THIS  VISIT:  New Prescriptions   No medications on file      Rockne Menghini, MD 08/18/16 1918

## 2016-08-18 NOTE — Anesthesia Post-op Follow-up Note (Cosign Needed)
Anesthesia QCDR form completed.        

## 2016-08-18 NOTE — Op Note (Addendum)
laparascopic appendectomy   Shane Dominguez Date of operation:  08/18/2016  Indications: The patient presented with a history of  abdominal pain. Workup has revealed findings consistent with acute appendicitis.  Pre-operative Diagnosis: Acute appendicitis without mention of peritonitis  Post-operative Diagnosis: Same  Surgeon: Sterling Bigiego Lachandra Dettmann, MD, FACS  Anesthesia: General with endotracheal tube  Findings:  1. Acute non perforated appendicitis 2. Reducible left indirect inguinal hernia 3. Chronically incarcerated right inguinal hernia, with likely a hydrocele causing the concentration. 4. No evidence of bowel incarceration on either side 5. Reactive inflammation of TI  Estimated Blood Loss: 5cc         Specimens: appendix         Complications:  none  Procedure Details  The patient was seen again in the preop area. The options of surgery versus observation were reviewed with the patient and/or family. The risks of bleeding, infection, recurrence of symptoms, negative laparoscopy, potential for an open procedure, bowel injury, abscess or infection, were all reviewed as well. The patient was taken to Operating Room, identified as Shane DarlingOdell C Ferrin and the procedure verified as laparoscopic appendectomy. A Time Out was held and the above information confirmed.  The patient was placed in the supine position and general anesthesia was induced.  Antibiotic prophylaxis was administered and VT E prophylaxis was in place. A   The abdomen was prepped and draped in a sterile fashion. An infraumbilical incision was made. A cutdown technique was used to enter the abdominal cavity. Two vicryl stitches were placed on the fascia and a Hasson trocar inserted. Pneumoperitoneum obtained. Two 5 mm ports were placed under direct visualization.   The appendix was identified and found to be acutely inflamed. BIH were visualized, findings above.   The appendix was carefully dissected. The mesoappendix was  divided withHarmonic scalpel. The base of the appendix was dissected out and divided with a standard load Endo GIA.The appendix was placed in a Endo Catch bag and removed via the Hasson port. The right lower quadrant and pelvis was then irrigated with  normal saline which was aspirated. Inspection  failed to identify any additional bleeding and there were no signs of bowel injury. Again the right lower quadrant was inspected there was no sign of bleeding or bowel injury therefore pneumoperitoneum was released, all ports were removed.  The umbilical fascia was closed with 0 Vicryl interrupted sutures and the skin incisions were approximated with subcuticular 4-0 Monocryl. Dermabond was placed The patient tolerated the procedure well, there were no complications. The sponge lap and needle count were correct at the end of the procedure.  The patient was taken to the recovery room in stable condition to be admitted for continued care.    Sterling Bigiego Tashea Othman, MD FACS

## 2016-08-18 NOTE — ED Triage Notes (Signed)
Pt to ED via POV with c/o generalizes abd pain xfew days, denies fever or n/v/d. Pt describes stool as being like "clay", LBM today. Pt states he has been lifting heavy appliances recently and may have injured abd muscles. Pt ambulatory, VS stable.

## 2016-08-18 NOTE — Anesthesia Preprocedure Evaluation (Signed)
Anesthesia Evaluation  Patient identified by MRN, date of birth, ID band Patient awake    Reviewed: Allergy & Precautions, H&P , NPO status , Patient's Chart, lab work & pertinent test results, reviewed documented beta blocker date and time   History of Anesthesia Complications Negative for: history of anesthetic complications  Airway Mallampati: II  TM Distance: >3 FB Neck ROM: full    Dental  (+) Poor Dentition, Missing, Dental Advidsory Given   Pulmonary neg shortness of breath, neg COPD, neg recent URI, Current Smoker,           Cardiovascular Exercise Tolerance: Good hypertension, (-) angina(-) CAD, (-) Past MI, (-) Cardiac Stents and (-) CABG (-) dysrhythmias (-) Valvular Problems/Murmurs     Neuro/Psych negative neurological ROS  negative psych ROS   GI/Hepatic GERD  ,(+) Hepatitis -, C  Endo/Other  negative endocrine ROS  Renal/GU negative Renal ROS  negative genitourinary   Musculoskeletal   Abdominal   Peds  Hematology negative hematology ROS (+)   Anesthesia Other Findings Past Medical History: No date: GERD (gastroesophageal reflux disease) No date: Hepatitis C No date: Hypertension   Reproductive/Obstetrics negative OB ROS                             Anesthesia Physical Anesthesia Plan  ASA: III and emergent  Anesthesia Plan: General, Rapid Sequence and Cricoid Pressure   Post-op Pain Management:    Induction:   PONV Risk Score and Plan: 1 and Ondansetron and Dexamethasone  Airway Management Planned:   Additional Equipment:   Intra-op Plan:   Post-operative Plan:   Informed Consent: I have reviewed the patients History and Physical, chart, labs and discussed the procedure including the risks, benefits and alternatives for the proposed anesthesia with the patient or authorized representative who has indicated his/her understanding and acceptance.   Dental  Advisory Given  Plan Discussed with: Anesthesiologist, CRNA and Surgeon  Anesthesia Plan Comments:         Anesthesia Quick Evaluation

## 2016-08-19 ENCOUNTER — Encounter: Payer: Self-pay | Admitting: Surgery

## 2016-08-19 MED ORDER — OXYCODONE HCL 5 MG PO TABS: 5.0000 mg | ORAL_TABLET | ORAL | 0 refills | Status: DC | PRN

## 2016-08-19 NOTE — Progress Notes (Signed)
Initial Nutrition Assessment  DOCUMENTATION CODES:   Not applicable  INTERVENTION:   Ensure Enlive po BID, each supplement provides 350 kcal and 20 grams of protein   NUTRITION DIAGNOSIS:   Inadequate oral intake related to acute illness as evidenced by per patient/family report.  GOAL:   Patient will meet greater than or equal to 90% of their needs  MONITOR:   PO intake, Supplement acceptance, Labs, Weight trends  REASON FOR ASSESSMENT:   Malnutrition Screening Tool    ASSESSMENT:   56 yo male admitted with acute appendicitis s/p appendectomy on 6/14. Pt with hx of GERD, hepatitis C, HTN, hernia repair  Pt not in room on visit today, diet just advanced to Regular. Recorded po intake 100% on CL diet, appetite good.   No recent wt encounters in system. Last weight from 3 years ago  Labs: sodium 134 Meds: LR at 125 ml/hr  Diet Order:  Diet regular Room service appropriate? Yes; Fluid consistency: Thin  Skin:  Reviewed, no issues  Last BM:  08/18/16  Height:   Ht Readings from Last 1 Encounters:  08/18/16 5\' 8"  (1.727 m)    Weight:   Wt Readings from Last 1 Encounters:  08/19/16 139 lb 4.8 oz (63.2 kg)    BMI:  Body mass index is 21.18 kg/m.  Estimated Nutritional Needs:   Kcal:  [id number] kcals  Protein:  95-110 g  Fluid:  >/= 1.9 L  EDUCATION NEEDS:   No education needs identified at this time  Shane StarcherCate Ciin Brazzel MS, RD, LDN [phone] Pager  [phone] Weekend/On-Call Pager

## 2016-08-19 NOTE — Progress Notes (Signed)
Discharge Instructions reviewed with patient and patient's visitors with patient's permission. Patient verbalized understanding. Patient given prescription for Vicodin and reminded of sedative warning; no drinking alcohol, driving or operating heaving machinery while taking this medication. All questions answered and patient reminded to follow up with at outpatient follow up appointment.

## 2016-08-19 NOTE — Discharge Instructions (Signed)
Laparoscopic Appendectomy, Adult, Care After °Refer to this sheet in the next few weeks. These instructions provide you with information about caring for yourself after your procedure. Your health care provider may also give you more specific instructions. Your treatment has been planned according to current medical practices, but problems sometimes occur. Call your health care provider if you have any problems or questions after your procedure. °What can I expect after the procedure? °After the procedure, it is common to have: °· A decrease in your energy level. °· Mild pain in the area where the surgical cuts (incisions) were made. °· Constipation. This can be caused by pain medicine and a decrease in your activity. °Follow these instructions at home: °Medicines  °· Take over-the-counter and prescription medicines only as told by your health care provider. °· Do not drive for 24 hours if you received a sedative. °· Do not drive or operate heavy machinery while taking prescription pain medicine. °· If you were prescribed an antibiotic medicine, take it as told by your health care provider. Do not stop taking the antibiotic even if you start to feel better. °Activity  °· For 3 weeks or as long as told by your health care provider: °¨ Do not lift anything that is heavier than 10 pounds (4.5 kg). °¨ Do not play contact sports. °· Gradually return to your normal activities. Ask your health care provider what activities are safe for you. °Bathing  °· Keep your incisions clean and dry. Clean them as often as told by your health care provider: °¨ Gently wash the incisions with soap and water. °¨ Rinse the incisions with water to remove all soap. °¨ Pat the incisions dry with a clean towel. Do not rub the incisions. °· You may take showers after 48 hours. °· Do not take baths, swim, or use hot tubs for 2 weeks or as told by your health care provider. °Incision care  °· Follow instructions from your healthcare provider  about how to take care of your incisions. Make sure you: °¨ Wash your hands with soap and water before you change your bandage (dressing). If soap and water are not available, use hand sanitizer. °¨ Change your dressing as told by your health care provider. °¨ Leave stitches (sutures), skin glue, or adhesive strips in place. These skin closures may need to stay in place for 2 weeks or longer. If adhesive strip edges start to loosen and curl up, you may trim the loose edges. Do not remove adhesive strips completely unless your health care provider tells you to do that. °· Check your incision areas every day for signs of infection. Check for: °¨ More redness, swelling, or pain. °¨ More fluid or blood. °¨ Warmth. °¨ Pus or a bad smell. °Other Instructions  °· If you were sent home with a drain, follow instructions from your health care provider about how to care for the drain and how to empty it. °· Take deep breaths. This helps to prevent your lungs from becoming inflamed. °· To relieve and prevent constipation: °¨ Drink plenty of fluids. °¨ Eat plenty of fruits and vegetables. °· Keep all follow-up visits as told by your health care provider. This is important. °Contact a health care provider if: °· You have more redness, swelling, or pain around an incision. °· You have more fluid or blood coming from an incision. °· Your incision feels warm to the touch. °· You have pus or a bad smell coming from an incision or   dressing. °· Your incision edges break open after your sutures have been removed. °· You have increasing pain in your shoulders. °· You feel dizzy or you faint. °· You develop shortness of breath. °· You keep feeling nauseous or vomiting. °· You have diarrhea or you cannot control your bowel functions. °· You lose your appetite. °· You develop swelling or pain in your legs. °Get help right away if: °· You have a fever. °· You develop a rash. °· You have difficulty breathing. °· You have sharp pains in your  chest. °This information is not intended to replace advice given to you by your health care provider. Make sure you discuss any questions you have with your health care provider. °Document Released: 02/21/2005 Document Revised: 07/24/2015 Document Reviewed: 08/11/2014 °Elsevier Interactive Patient Education © 2017 [employer] ° °

## 2016-08-19 NOTE — Discharge Summary (Signed)
Patient ID: Shane Dominguez MRN: [id number] DOB/AGE: [birth year] 56 y.o.  Admit date: 08/18/2016 Discharge date: 08/19/2016  Discharge Diagnoses:  Appendicitis  Procedures Performed: Laparoscopic appendectomy  Discharged Condition: good  Hospital Course: Patient taken to the operating room from the emergency department for acute appendicitis. Tolerated the procedure well. On the day of discharge patient was tolerating diet and his abdomen was soft, nontender, nondistended. Dressings were intact without any evidence of purulence.  Discharge Orders: Discharge Instructions    Call MD for:  difficulty breathing, headache or visual disturbances    Complete by:  As directed    Call MD for:  persistant nausea and vomiting    Complete by:  As directed    Call MD for:  redness, tenderness, or signs of infection (pain, swelling, redness, odor or green/yellow discharge around incision site)    Complete by:  As directed    Call MD for:  severe uncontrolled pain    Complete by:  As directed    Call MD for:  temperature >100.4    Complete by:  As directed    Diet - low sodium heart healthy    Complete by:  As directed    Increase activity slowly    Complete by:  As directed       Disposition: 01-Home or Self Care  Discharge Medications: Allergies as of 08/19/2016   No Known Allergies     Medication List    TAKE these medications   multivitamin tablet Take 1 tablet by mouth daily.   oxyCODONE 5 MG immediate release tablet Commonly known as:  Oxy IR/ROXICODONE Take 1-2 tablets (5-10 mg total) by mouth every 4 (four) hours as needed for moderate pain.        Follwup: Follow-up Information    [facility] [place] Go in 2 week(s).   Specialty:  General Surgery Why:  postop Contact information: [address] [place] [id number]          Signed: Ricarda FrameCharles Rickelle Dominguez 08/19/2016, 1:56 PM

## 2016-08-20 LAB — HIV ANTIBODY (ROUTINE TESTING W REFLEX): HIV Screen 4th Generation wRfx: NONREACTIVE

## 2016-08-22 ENCOUNTER — Telehealth: Payer: Self-pay

## 2016-08-22 LAB — SURGICAL PATHOLOGY

## 2016-08-22 NOTE — Anesthesia Postprocedure Evaluation (Signed)
Anesthesia Post Note  Patient: Shane Dominguez  Procedure(s) Performed: Procedure(s) (LRB): APPENDECTOMY LAPAROSCOPIC (N/A)  Patient location during evaluation: PACU Anesthesia Type: General Level of consciousness: awake and alert Pain management: pain level controlled Vital Signs Assessment: post-procedure vital signs reviewed and stable Respiratory status: spontaneous breathing, nonlabored ventilation, respiratory function stable and patient connected to nasal cannula oxygen Cardiovascular status: blood pressure returned to baseline and stable Postop Assessment: no signs of nausea or vomiting Anesthetic complications: no     Last Vitals:  Vitals:   08/19/16 0925 08/19/16 1138  BP: (!) 141/73 (!) 163/78  Pulse: 64 65  Resp: 18 18  Temp: 36.7 C 37 C    Last Pain:  Vitals:   08/19/16 1338  TempSrc:   PainSc: 0-No pain                 Lenard SimmerAndrew Tasha Diaz

## 2016-08-22 NOTE — Telephone Encounter (Signed)
Post-op call made to patient at this time. Unable to leave a message due to not having a voicemail setup.

## 2016-08-22 NOTE — Telephone Encounter (Signed)
Post-op call made to patient at this time. Spoke with patient fiance Post-op interview questions below.  1. How are you feeling? Doing well a little having some sorness  2. Is your pain controlled? Yes  3. What are you doing for the pain? Taking one in the morning and one at night   4. Are you having any Nausea or Vomiting? None  5. Are you having any Fever or Chills? Had a low grade fever the first day home has not had one since  6. Are you having any Constipation or Diarrhea? Some constipation  7. Is there any Swelling or Bruising you are concerned about? None  8. Do you have any questions or concerns at this time? None   Discussion: Advised him to take ibuprofen for his slight pain instead of the oxycodone since he is have some constipation.   Reminded him of his appointment on 6/28.

## 2016-09-01 ENCOUNTER — Encounter: Payer: Self-pay | Admitting: Surgery

## 2016-09-01 ENCOUNTER — Ambulatory Visit (INDEPENDENT_AMBULATORY_CARE_PROVIDER_SITE_OTHER): Payer: Medicaid Other | Admitting: Surgery

## 2016-09-01 VITALS — BP 179/87 | HR 65 | Temp 98.1°F | Ht 68.0 in | Wt 142.6 lb

## 2016-09-01 DIAGNOSIS — K4020 Bilateral inguinal hernia, without obstruction or gangrene, not specified as recurrent: Secondary | ICD-10-CM

## 2016-09-01 DIAGNOSIS — Z4889 Encounter for other specified surgical aftercare: Secondary | ICD-10-CM

## 2016-09-01 DIAGNOSIS — K219 Gastro-esophageal reflux disease without esophagitis: Secondary | ICD-10-CM | POA: Insufficient documentation

## 2016-09-01 DIAGNOSIS — K3580 Unspecified acute appendicitis: Secondary | ICD-10-CM

## 2016-09-01 NOTE — Patient Instructions (Addendum)
We would like for you to take Prilosec over the counter for your Acid reflux. We will send a referral to the Gastroenterologist and someone from their office will call and schedule an appointment. If you have not heard from them in 7-10 days please let our office know so we can check on this. They will also schedule a Colonoscopy.  I have scheduled an appointment with Dr.Cooper so he can talk to you about your Hernia's.   Please take Miralax daily for your constipation.   Pease call our office with any questions or concerns.

## 2016-09-01 NOTE — Progress Notes (Signed)
Surgical Clinic Progress/Follow-up Note   HPI:  56 y.o. Male presents to clinic for post-op evaluation 14 days s/p laparoscopic appendectomy. Patient reports only mild peri-incisional peri-umbilical pain that has continued to improve daily with +flatus and mostly daily BM's, though sometimes with prolonged straining and hard stools. He otherwise complains of chronic severe Right >> Left groin pain associated with B/L inguinal hernias that he's had for >10 years, but for which he's never previously sought evaluation or care. He denies frequent, productive, or bloody coughing or straining with urination and denies fever/chills. Patient also states he has not resumed smoking (1 - 1.5 ppd) since his surgery and says he is able to ambulate several blocks and up/down a flight of stairs without CP or SOB. He lastly complains of frequent GERD, for which he does not take any medication, stating it doesn't help, and denies having ever previously been evaluated by GI for GERD, EGD, or for colonoscopy.   Review of Systems:  Constitutional: denies any other weight loss, fever, chills, or sweats  Eyes: denies any other vision changes, history of eye injury  ENT: denies sore throat, hearing problems  Respiratory: denies shortness of breath, wheezing  Cardiovascular: denies chest pain, palpitations  Gastrointestinal: abdominal pain, N/V, and bowel function as per HPI Musculoskeletal: denies any other joint pains or cramps  Skin: Denies any other rashes or skin discolorations  Neurological: denies any other headache, dizziness, weakness  Psychiatric: denies any other depression, anxiety  All other review of systems: otherwise negative   Vital Signs:  BP (!) 179/87   Pulse 65   Temp 98.1 F (36.7 C) (Oral)   Ht 5\' 8"  (1.727 m)   Wt 142 lb 9.6 oz (64.7 kg)   BMI 21.68 kg/m    Physical Exam:  Constitutional:  -- Normal body habitus  -- Awake, alert, and oriented x3  Eyes:  -- Pupils equally round  and reactive to light  -- No scleral icterus  Ear, nose, throat:  -- No jugular venous distension  -- No nasal drainage, bleeding Pulmonary:  -- No crackles -- Equal breath sounds bilaterally -- Breathing non-labored at rest Cardiovascular:  -- S1, S2 present  -- No pericardial rubs  Gastrointestinal:  -- Laparoscopic port site incisions well-approximated without any surrounding erythema or drainage  -- Soft and nondistended with mild peri-umbilical peri-incisional tenderness to deep palpation, no guarding/rebound -- Right inguinal hernia ultimately able to be reduced with significant progressive effort and immediate relief post-reduction, B/L inguinal hernias now easily reducible -- No abdominal masses appreciated, pulsatile or otherwise  Musculoskeletal / Integumentary:  -- Wounds or skin discoloration: None except post-surgical wounds as described above (GI) -- Extremities: B/L UE and LE FROM, hands and feet warm, no edema  Neurologic:  -- Motor function: intact and symmetric  -- Sensation: intact and symmetric   Assessment:  56 y.o. yo Male with a problem list including...  Patient Active Problem List   Diagnosis Date Noted  . Bilateral inguinal hernia without obstruction or gangrene 09/01/2016  . GERD (gastroesophageal reflux disease) 09/01/2016  . Appendicitis 08/18/2016  . Acute appendicitis     presents to clinic for post-op follow-up evaluation, doing well from post-surgical perspective 14 days s/p laparoscopic appendectomy, also with a chronically incarcerated (difficult to reduce, but ultimately reducible) Right inguinal hernia and easily reducible Left inguinal hernia along with chronic uncontrolled GERD, complicated by comorbidities including HTN, hepatitis C, until very recently chronic tobacco abuse, and a lack of routine/preventitive medical care.  Plan:   - incarcerated Right inguinal hernia reduced with some effort and immediate relief  - strategies for  self-reduction of Right >> Left inguinal hernias and signs/symptoms of obstruction discussed  - patient advised to call office or proceed to ED if not passing flatus >4 hours in context of non-reducible hernia  - referral to Dr. Excell Seltzer or Dr. Tonita Cong for consideration of laparoscopic repair of B/L inguinal hernias following recovery from laparoscopic appendectomy  - patient applauded for smoking cessation since his recent surgery, encouraged to continue to abstain from smoking   - take Prilosec daily, referral to GI for management of chronic poorly-controlled GERD and overdue colonoscopy  - return to clinic as scheduled, instructed to call office if any questions or concerns  All of the above recommendations were discussed with the patient and patient's family, and all of patient's and family's questions were answered to their expressed satisfaction.  -- Scherrie Gerlach Earlene Plater, MD, RPVI [place]: [facility] General Surgery - Partnering for exceptional care. Office: [phone]

## 2016-09-15 ENCOUNTER — Encounter: Payer: Self-pay | Admitting: Surgery

## 2016-09-15 ENCOUNTER — Ambulatory Visit (INDEPENDENT_AMBULATORY_CARE_PROVIDER_SITE_OTHER): Payer: Medicaid Other | Admitting: Surgery

## 2016-09-15 VITALS — BP 149/82 | HR 70 | Temp 98.1°F | Ht 68.0 in | Wt 143.4 lb

## 2016-09-15 DIAGNOSIS — K3580 Unspecified acute appendicitis: Secondary | ICD-10-CM

## 2016-09-15 NOTE — Progress Notes (Signed)
Outpatient postop visit  09/15/2016  Shane Dominguez is an 56 y.o. male.    Procedure: Laparoscopic appendectomy 2-1/2 weeks ago by Dr. Everlene FarrierPabon  CC: Minimal pain  HPI: This patient status post laparoscopic appendectomy where bilateral inguinal hernias were identified. This is done by Dr. Everlene FarrierPabon on June 18. He describes minimal pain at this time and no fevers or chills. He has no complaints at this time with the exception of minimal pain. He also has bilateral inguinal hernias that he wants to discuss repair.  Medications reviewed.    Physical Exam:  BP (!) 149/82   Pulse 70   Temp 98.1 F (36.7 C) (Oral)   Ht 5\' 8"  (1.727 m)   Wt 143 lb 6.4 oz (65 kg)   BMI 21.80 kg/m     PE: Patient examined standing and supine huge scrotal hernia is larger on the left than on the right neither of which are completely reducible. There are nontender nonerythematous Abdomen is otherwise soft and nontender with healing wounds no erythema or drainage    Assessment/Plan:  Pathology reviewed confirming the diagnosis without sign of malignancy. He is doing quite well and has bilaterally or hernias that were require repair at some point I will have him see Dr. Everlene FarrierPabon in 1 month.  Lattie Hawichard E Suzanne Garbers, MD, FACS

## 2016-09-15 NOTE — Patient Instructions (Signed)
We will see you back in our office as listed below.   If you have any questions or concerns or you need to be seen back in our office please give us a call.

## 2016-10-19 ENCOUNTER — Encounter: Payer: Self-pay | Admitting: Surgery

## 2016-10-19 ENCOUNTER — Ambulatory Visit (INDEPENDENT_AMBULATORY_CARE_PROVIDER_SITE_OTHER): Payer: Medicaid Other | Admitting: Surgery

## 2016-10-19 VITALS — BP 164/88 | HR 67 | Temp 98.1°F | Ht 68.0 in | Wt 147.6 lb

## 2016-10-19 DIAGNOSIS — K4020 Bilateral inguinal hernia, without obstruction or gangrene, not specified as recurrent: Secondary | ICD-10-CM

## 2016-10-19 NOTE — Progress Notes (Signed)
Surgical Consultation  10/19/2016  Shane Dominguez is an 56 y.o. male.   Chief Complaint  Patient presents with  . Other    Est Patient:  Bilateral Ingunial Hernia's     HPI: Comes for evaluation of BIH.He has had bilateral inguinal hernias for several years and is unable to reduce the left side. He experiences intermittent pain especially in the left side that is sharp moderate in intensity. No evidence of bowel obstruction. No previous repairs. CT scan personally reviewed and there is evidence of bilateral inguinal hernias with the left side containing loops of bowel. Of note he did have an appendectomy by me about a month and a half ago and did very well. He has no planes related to his appendectomy.   Past Medical History:  Diagnosis Date  . Acute appendicitis   . Appendicitis 08/18/2016  . GERD (gastroesophageal reflux disease)   . Hepatitis C   . Hypertension     Past Surgical History:  Procedure Laterality Date  . HERNIA REPAIR    . LAPAROSCOPIC APPENDECTOMY N/A 08/18/2016   Procedure: APPENDECTOMY LAPAROSCOPIC;  Surgeon: Leafy RoPabon, Lillyn Wieczorek F, MD;  Location: [facility] ORS;  Service: General;  Laterality: N/A;    Family History  Problem Relation Age of Onset  . Stroke Mother   . COPD Father   . Emphysema Father   . Stroke Brother     Social History:  reports that he has been smoking.  He has never used smokeless tobacco. He reports that he does not drink alcohol or use drugs.  Allergies: No Known Allergies  Medications reviewed.   ROS Full ROS performed and is otherwise negative other than what is stated in the HPI    BP (!) 164/88   Pulse 67   Temp 98.1 F (36.7 C) (Oral)   Ht 5\' 8"  (1.727 m)   Wt 67 kg (147 lb 9.6 oz)   BMI 22.44 kg/m   Physical Exam  Constitutional: He is oriented to person, place, and time and well-developed, well-nourished, and in no distress. No distress.  Eyes: Right eye exhibits no discharge. Left eye exhibits no discharge. No scleral  icterus.  Neck: Normal range of motion. No JVD present. No tracheal deviation present.  Cardiovascular: Normal rate and intact distal pulses.   Pulmonary/Chest: Effort normal. No respiratory distress.  Abdominal: Soft. He exhibits no distension. There is no tenderness. There is no guarding.  Musculoskeletal: Normal range of motion.  Neurological: He is alert and oriented to person, place, and time. GCS score is 15.  Skin: Skin is warm and dry. He is not diaphoretic.  Psychiatric: Mood, memory, affect and judgment normal.  Nursing note and vitals reviewed.  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 I do think that we be reasonable to attempt a laparoscopic repair but there is a good chance that we may end up doing an open repair especially for the left side. Plan for lap BIH repair. I discussed possibility of incarceration, strangulation, enlargement in size over time, and the risk of emergency surgery in the face of strangulation.  Also discussed the risk of surgery including recurrence which can be up to 30% in the case of complex hernias, use of prosthetic materials (mesh) and the increased risk of infxn, post-op infxn and the possible need for re-operation and removal of mesh if used, possibility of post-op SBO or ileus, and the risks of general anesthetic including MI, CVA,  sudden death or even reaction to anesthetic medications. The patient and family understands the risks, any and all questions were answered to the patient's satisfaction.   Shane Bigiego Amerigo Mcglory, MD [facility] Care[facility] General Surgeon

## 2016-10-19 NOTE — Patient Instructions (Signed)
Please continue to NOT smoke. Smoking will increase the hernias to come back again.   We have scheduled your Bilateral Inguinal Hernia Repair for 9/13 with Dr. Everlene FarrierPabon.  Please see your blue sheet for further instructions.

## 2016-10-20 ENCOUNTER — Telehealth: Payer: Self-pay | Admitting: Surgery

## 2016-10-20 NOTE — Telephone Encounter (Signed)
Pt advised of pre op date/time and sx date. Sx: 11/17/16 with Dr Pabon--Laparoscopic bilateral inguinal hernia repair, possibly open.  Pre op: 11/10/16 between 9-1:00pm--Phone.   Patient made aware to call [phone], between 1-3:00pm the day before surgery, to find out what time to arrive.

## 2016-11-01 ENCOUNTER — Ambulatory Visit: Payer: Medicaid Other | Admitting: Gastroenterology

## 2016-11-10 ENCOUNTER — Encounter
Admission: RE | Admit: 2016-11-10 | Discharge: 2016-11-10 | Disposition: A | Discharge status: Home / Self Care | Payer: Medicaid Other | Source: Ambulatory Visit | Attending: Surgery | Admitting: Surgery

## 2016-11-10 NOTE — Patient Instructions (Signed)
Your procedure is scheduled on: 11-17-16 Report to Same Day Surgery 2nd floor medical mall (Medical Mall Entrance-take elevator on left to 2nd floor.  Check in with surgery information desk.) To find out your arrival time please call [phone] between 1PM - 3PM on 11-16-16  Remember: Instructions that are not followed completely may result in serious medical risk, up to and including death, or upon the discretion of your surgeon and anesthesiologist your surgery may need to be rescheduled.    _x___ 1. Do not eat food after midnight the night before your procedure. You may drink clear liquids up to 2 hours before you are scheduled to arrive at the hospital for your procedure.  Do not drink clear liquids within 2 hours of your scheduled arrival to the hospital.  Clear liquids include  --Water or Apple juice without pulp  --Clear carbohydrate beverage such as ClearFast or Gatorade  --Black Coffee or Clear Tea (No milk, no creamers, do not add anything to                  the coffee or Tea Type 1 and type 2 diabetics should only drink water.  No gum chewing or hard candies.     __x__ 2. No Alcohol for 24 hours before or after surgery.   __x__3. No Smoking for 24 prior to surgery.   ____  4. Bring all medications with you on the day of surgery if instructed.    __x__ 5. Notify your doctor if there is any change in your medical condition     (cold, fever, infections).     Do not wear jewelry, make-up, hairpins, clips or nail polish.  Do not wear lotions, powders, or perfumes. You may wear deodorant.  Do not shave 48 hours prior to surgery. Men may shave face and neck.  Do not bring valuables to the hospital.    [facility] is not responsible for any belongings or valuables.               Contacts, dentures or bridgework may not be worn into surgery.  Leave your suitcase in the car. After surgery it may be brought to your room.  For patients admitted to the hospital, discharge time is  determined by your                       treatment team.   Patients discharged the day of surgery will not be allowed to drive home.  You will need someone to drive you home and stay with you the night of your procedure.    Please read over the following fact sheets that you were given:   [facility] for [facility] and or MRSA Information   ____ Take anti-hypertensive listed below, cardiac, seizure, asthma,     anti-reflux and psychiatric medicines. These include:  1. NONE  2.  3.  4.  5.  6.  ____Fleets enema or Magnesium Citrate as directed.   ____ Use CHG Soap or sage wipes as directed on instruction sheet   ____ Use inhalers on the day of surgery and bring to hospital day of surgery  ____ Stop Metformin and Janumet 2 days prior to surgery.    ____ Take 1/2 of usual insulin dose the night before surgery and none on the morning surgery.   ____ Follow recommendations from Cardiologist, Pulmonologist or PCP regarding stopping Aspirin, Coumadin, Plavix ,Eliquis, Effient, or Pradaxa, and Pletal.  X____Stop Anti-inflammatories such   as Advil, Aleve, Ibuprofen, Motrin, Naproxen, Naprosyn, Goodies powders or aspirin products NOW-OK to take Tylenol    ____ Stop supplements until after surgery.     ____ Bring C-Pap to the hospital.    

## 2016-11-16 MED ORDER — CEFAZOLIN SODIUM-DEXTROSE 2-4 GM/100ML-% IV SOLN
2.0000 g | INTRAVENOUS | Status: AC
  Administered 2016-11-17: 2 g via INTRAVENOUS

## 2016-11-17 ENCOUNTER — Encounter
Admission: RE | Disposition: A | Discharge status: Home / Self Care | Payer: Self-pay | Source: Ambulatory Visit | Attending: Surgery

## 2016-11-17 ENCOUNTER — Ambulatory Visit
Admission: RE | Admit: 2016-11-17 | Discharge: 2016-11-17 | Disposition: A | Discharge status: Home / Self Care | Payer: Medicaid Other | Source: Ambulatory Visit | Attending: Surgery | Admitting: Surgery

## 2016-11-17 ENCOUNTER — Ambulatory Visit: Payer: Medicaid Other | Admitting: Anesthesiology

## 2016-11-17 ENCOUNTER — Encounter: Payer: Self-pay | Admitting: *Deleted

## 2016-11-17 DIAGNOSIS — F17200 Nicotine dependence, unspecified, uncomplicated: Secondary | ICD-10-CM | POA: Diagnosis not present

## 2016-11-17 DIAGNOSIS — I10 Essential (primary) hypertension: Secondary | ICD-10-CM | POA: Diagnosis not present

## 2016-11-17 DIAGNOSIS — K219 Gastro-esophageal reflux disease without esophagitis: Secondary | ICD-10-CM | POA: Diagnosis not present

## 2016-11-17 DIAGNOSIS — B1920 Unspecified viral hepatitis C without hepatic coma: Secondary | ICD-10-CM | POA: Diagnosis not present

## 2016-11-17 DIAGNOSIS — Z79899 Other long term (current) drug therapy: Secondary | ICD-10-CM | POA: Insufficient documentation

## 2016-11-17 DIAGNOSIS — K4020 Bilateral inguinal hernia, without obstruction or gangrene, not specified as recurrent: Secondary | ICD-10-CM

## 2016-11-17 HISTORY — PX: INGUINAL HERNIA REPAIR: SHX194

## 2016-11-17 HISTORY — PX: INSERTION OF MESH: SHX5868

## 2016-11-17 SURGERY — REPAIR, HERNIA, INGUINAL, BILATERAL, LAPAROSCOPIC
Anesthesia: General | Site: Inguinal | Laterality: Bilateral | Wound class: Clean

## 2016-11-17 MED ORDER — FAMOTIDINE 20 MG PO TABS
20.0000 mg | ORAL_TABLET | Freq: Once | ORAL | Status: AC
  Administered 2016-11-17: 20 mg via ORAL

## 2016-11-17 MED ORDER — LACTATED RINGERS IV SOLN
INTRAVENOUS | Status: DC
  Administered 2016-11-17 (×2): via INTRAVENOUS

## 2016-11-17 MED ORDER — FENTANYL CITRATE (PF) 250 MCG/5ML IJ SOLN
INTRAMUSCULAR | Status: AC
  Filled 2016-11-17: qty 5

## 2016-11-17 MED ORDER — PHENYLEPHRINE HCL 10 MG/ML IJ SOLN
INTRAMUSCULAR | Status: DC | PRN
  Administered 2016-11-17 (×2): 100 ug via INTRAVENOUS

## 2016-11-17 MED ORDER — PROPOFOL 10 MG/ML IV BOLUS
INTRAVENOUS | Status: AC
  Filled 2016-11-17: qty 20

## 2016-11-17 MED ORDER — KETOROLAC TROMETHAMINE 30 MG/ML IJ SOLN
INTRAMUSCULAR | Status: DC | PRN
  Administered 2016-11-17: 30 mg via INTRAVENOUS

## 2016-11-17 MED ORDER — ACETAMINOPHEN 10 MG/ML IV SOLN
INTRAVENOUS | Status: AC
  Filled 2016-11-17: qty 100

## 2016-11-17 MED ORDER — DEXAMETHASONE SODIUM PHOSPHATE 10 MG/ML IJ SOLN
INTRAMUSCULAR | Status: DC | PRN
  Administered 2016-11-17: 10 mg via INTRAVENOUS

## 2016-11-17 MED ORDER — ACETAMINOPHEN 10 MG/ML IV SOLN
INTRAVENOUS | Status: DC | PRN
  Administered 2016-11-17: 1000 mg via INTRAVENOUS

## 2016-11-17 MED ORDER — SUGAMMADEX SODIUM 200 MG/2ML IV SOLN
INTRAVENOUS | Status: DC | PRN
  Administered 2016-11-17: 134.2 mg via INTRAVENOUS

## 2016-11-17 MED ORDER — FENTANYL CITRATE (PF) 100 MCG/2ML IJ SOLN
25.0000 ug | INTRAMUSCULAR | Status: DC | PRN
  Administered 2016-11-17 (×3): 25 ug via INTRAVENOUS

## 2016-11-17 MED ORDER — SEVOFLURANE IN SOLN
RESPIRATORY_TRACT | Status: AC
  Filled 2016-11-17: qty 250

## 2016-11-17 MED ORDER — BUPIVACAINE-EPINEPHRINE (PF) 0.25% -1:200000 IJ SOLN
INTRAMUSCULAR | Status: DC | PRN
  Administered 2016-11-17: 30 mL

## 2016-11-17 MED ORDER — FENTANYL CITRATE (PF) 100 MCG/2ML IJ SOLN
INTRAMUSCULAR | Status: AC
  Administered 2016-11-17: 25 ug via INTRAVENOUS
  Filled 2016-11-17: qty 2

## 2016-11-17 MED ORDER — LIDOCAINE 2% (20 MG/ML) 5 ML SYRINGE
INTRAMUSCULAR | Status: DC | PRN
Start: 2016-11-17 — End: 2016-11-17
  Administered 2016-11-17: 100 mg via INTRAVENOUS

## 2016-11-17 MED ORDER — DEXAMETHASONE SODIUM PHOSPHATE 10 MG/ML IJ SOLN
INTRAMUSCULAR | Status: AC
  Filled 2016-11-17: qty 1

## 2016-11-17 MED ORDER — CHLORHEXIDINE GLUCONATE CLOTH 2 % EX PADS
6.0000 | MEDICATED_PAD | Freq: Once | CUTANEOUS | Status: AC
  Administered 2016-11-17: 6 via TOPICAL

## 2016-11-17 MED ORDER — SUGAMMADEX SODIUM 200 MG/2ML IV SOLN
INTRAVENOUS | Status: AC
  Filled 2016-11-17: qty 2

## 2016-11-17 MED ORDER — PROPOFOL 10 MG/ML IV BOLUS
INTRAVENOUS | Status: DC | PRN
  Administered 2016-11-17: 150 mg via INTRAVENOUS

## 2016-11-17 MED ORDER — BUPIVACAINE-EPINEPHRINE (PF) 0.25% -1:200000 IJ SOLN
INTRAMUSCULAR | Status: AC
  Filled 2016-11-17: qty 30

## 2016-11-17 MED ORDER — MIDAZOLAM HCL 2 MG/2ML IJ SOLN
INTRAMUSCULAR | Status: DC | PRN
  Administered 2016-11-17: 2 mg via INTRAVENOUS

## 2016-11-17 MED ORDER — ROCURONIUM BROMIDE 100 MG/10ML IV SOLN
INTRAVENOUS | Status: DC | PRN
  Administered 2016-11-17: 40 mg via INTRAVENOUS
  Administered 2016-11-17: 10 mg via INTRAVENOUS

## 2016-11-17 MED ORDER — ONDANSETRON HCL 4 MG/2ML IJ SOLN
INTRAMUSCULAR | Status: DC | PRN
  Administered 2016-11-17: 4 mg via INTRAVENOUS

## 2016-11-17 MED ORDER — ROCURONIUM BROMIDE 50 MG/5ML IV SOLN
INTRAVENOUS | Status: AC
  Filled 2016-11-17: qty 1

## 2016-11-17 MED ORDER — ONDANSETRON HCL 4 MG/2ML IJ SOLN
INTRAMUSCULAR | Status: AC
  Filled 2016-11-17: qty 2

## 2016-11-17 MED ORDER — FENTANYL CITRATE (PF) 100 MCG/2ML IJ SOLN
INTRAMUSCULAR | Status: DC | PRN
  Administered 2016-11-17: 50 ug via INTRAVENOUS
  Administered 2016-11-17 (×2): 100 ug via INTRAVENOUS

## 2016-11-17 MED ORDER — FAMOTIDINE 20 MG PO TABS
ORAL_TABLET | ORAL | Status: AC
  Filled 2016-11-17: qty 1

## 2016-11-17 MED ORDER — HYDROCODONE-ACETAMINOPHEN 5-325 MG PO TABS: 1.0000 | ORAL_TABLET | Freq: Four times a day (QID) | ORAL | 0 refills | Status: DC | PRN

## 2016-11-17 MED ORDER — ONDANSETRON HCL 4 MG/2ML IJ SOLN: 4.0000 mg | Freq: Once | INTRAMUSCULAR | Status: DC | PRN

## 2016-11-17 MED ORDER — CEFAZOLIN SODIUM-DEXTROSE 2-4 GM/100ML-% IV SOLN
INTRAVENOUS | Status: AC
  Filled 2016-11-17: qty 100

## 2016-11-17 MED ORDER — CHLORHEXIDINE GLUCONATE CLOTH 2 % EX PADS: 6.0000 | MEDICATED_PAD | Freq: Once | CUTANEOUS | Status: DC

## 2016-11-17 MED ORDER — MIDAZOLAM HCL 2 MG/2ML IJ SOLN
INTRAMUSCULAR | Status: AC
  Filled 2016-11-17: qty 2

## 2016-11-17 SURGICAL SUPPLY — 53 items
APPLICATOR COTTON TIP 6IN STRL (MISCELLANEOUS) ×4 IMPLANT
BLADE CLIPPER SURG (BLADE) IMPLANT
CANISTER SUCT 1200ML W/VALVE (MISCELLANEOUS) ×4 IMPLANT
CHLORAPREP W/TINT 26ML (MISCELLANEOUS) ×8 IMPLANT
COVER LIGHT HANDLE STERIS (MISCELLANEOUS) ×4 IMPLANT
DEFOGGER SCOPE WARMER CLEARIFY (MISCELLANEOUS) ×4 IMPLANT
DERMABOND ADVANCED (GAUZE/BANDAGES/DRESSINGS) ×2
DERMABOND ADVANCED .7 DNX12 (GAUZE/BANDAGES/DRESSINGS) ×2 IMPLANT
DEVICE SECURE STRAP 25 ABSORB (INSTRUMENTS) ×4 IMPLANT
DISSECT BALLN SPACEMKR OVL PDB (BALLOONS) ×4
DISSECTOR BALLN SPCMKR OVL PDB (BALLOONS) ×2 IMPLANT
DISSECTOR KITTNER STICK (MISCELLANEOUS) ×4 IMPLANT
DISSECTORS/KITTNER STICK (MISCELLANEOUS) ×8
DRAIN PENROSE 5/8X18 LTX STRL (WOUND CARE) IMPLANT
DRAPE INCISE IOBAN 66X45 STRL (DRAPES) ×8 IMPLANT
DRAPE PED LAPAROTOMY (DRAPES) ×4 IMPLANT
ELECT REM PT RETURN 9FT ADLT (ELECTROSURGICAL) ×4
ELECTRODE REM PT RTRN 9FT ADLT (ELECTROSURGICAL) ×2 IMPLANT
ENDOLOOP SUT PDS II  0 18 (SUTURE) ×2
ENDOLOOP SUT PDS II 0 18 (SUTURE) ×2 IMPLANT
GLOVE BIO SURGEON STRL SZ7 (GLOVE) ×4 IMPLANT
GOWN STRL REUS W/ TWL LRG LVL3 (GOWN DISPOSABLE) ×6 IMPLANT
GOWN STRL REUS W/TWL LRG LVL3 (GOWN DISPOSABLE) ×6
IRRIGATION STRYKERFLOW (MISCELLANEOUS) IMPLANT
IRRIGATOR STRYKERFLOW (MISCELLANEOUS)
IV NS 1000ML (IV SOLUTION) ×2
IV NS 1000ML BAXH (IV SOLUTION) ×2 IMPLANT
L-HOOK LAP DISP 36CM (ELECTROSURGICAL) ×4
LHOOK LAP DISP 36CM (ELECTROSURGICAL) ×2 IMPLANT
MESH 3DMAX 3X5 LT MED (Mesh General) ×4 IMPLANT
MESH 3DMAX 3X5 RT MED (Mesh General) ×4 IMPLANT
NEEDLE HYPO 22GX1.5 SAFETY (NEEDLE) ×4 IMPLANT
NS IRRIG 1000ML POUR BTL (IV SOLUTION) ×4 IMPLANT
NS IRRIG 500ML POUR BTL (IV SOLUTION) ×4 IMPLANT
PACK BASIN MINOR ARMC (MISCELLANEOUS) IMPLANT
PACK LAP CHOLECYSTECTOMY (MISCELLANEOUS) ×4 IMPLANT
PENCIL ELECTRO HAND CTR (MISCELLANEOUS) ×4 IMPLANT
SCISSORS METZENBAUM CVD 33 (INSTRUMENTS) ×4 IMPLANT
SPONGE KITTNER 5P (MISCELLANEOUS) IMPLANT
SPONGE LAP 18X18 5 PK (GAUZE/BANDAGES/DRESSINGS) ×4 IMPLANT
SURGILUBE 2OZ TUBE FLIPTOP (MISCELLANEOUS) ×4 IMPLANT
SUT ETHIBOND NAB MO 7 #0 18IN (SUTURE) IMPLANT
SUT MNCRL AB 4-0 PS2 18 (SUTURE) ×4 IMPLANT
SUT VIC AB 3-0 54X BRD REEL (SUTURE) IMPLANT
SUT VIC AB 3-0 BRD 54 (SUTURE)
SUT VIC AB 3-0 SH 27 (SUTURE) ×2
SUT VIC AB 3-0 SH 27X BRD (SUTURE) ×2 IMPLANT
SUT VICRYL 0 AB UR-6 (SUTURE) ×4 IMPLANT
SYR 20CC LL (SYRINGE) ×4 IMPLANT
TRAY FOLEY CATH SILVER 16FR LF (SET/KITS/TRAYS/PACK) IMPLANT
TROCAR 5MM SINGLE VERSAONE (TROCAR) ×8 IMPLANT
TROCAR BALLN 10M OMST10SB SPAC (TROCAR) ×4 IMPLANT
TUBING INSUFFLATOR HI FLOW (MISCELLANEOUS) ×4 IMPLANT

## 2016-11-17 NOTE — Op Note (Signed)
Laparoscopic Bilateral Inguinal Hernia Repair  [name]  11/17/2016  Pre-operative Diagnosis: Bilateral Inguinal Hernia  Post-operative Diagnosis: Bilateral  Inguinal hernia  Procedure: Enhanced view Laparoscopic preperitoneal repair of bilateral inguinal hernias with 3 D medium mesh BARD  Surgeon: Sterling Bigiego Yeriel Mineo, MD FACS  Anesthesia: Gen. with endotracheal tube  Findings: Indirect BIH   Procedure Details  The patient was seen again in the Holding Room. The benefits, complications, treatment options, and expected outcomes were discussed with the patient. The risks of bleeding, infection, recurrence of symptoms, failure to resolve symptoms, recurrence of hernia, ischemic orchitis, chronic pain syndrome or neuroma, were discussed again. The likelihood of improving the patient's symptoms with return to their baseline status is good.  The patient and/or family concurred with the proposed plan, giving informed consent.  The patient was taken to Operating Room, identified as Shane Dominguez and the procedure verified as Laparoscopic Inguinal Hernia Repair. Laterality confirmed.  A Time Out was held and the above information confirmed.  Prior to the induction of general anesthesia, antibiotic prophylaxis was administered. VTE prophylaxis was in place. General endotracheal anesthesia was then administered and tolerated well. After the induction, the abdomen was prepped with Chloraprep and draped in the sterile fashion. The patient was positioned in the supine position.  Local anesthetic  was injected into the skin near the umbilicus and a supraumbilical incision made. An incision was made and dissection down to the rectus fascia was performed. The fascia was incised and the muscle retracted laterally. The Covidien dissecting balloon was placed.  The dissecting Balloon sytem was removed but the actual balloon came of the laparoscopic port. We were able to identified this incident and remove the  Balloon under direct visualization w kelly clamps. We will notify the manufacturer about the malfunction of the device.   The preperitoneal space was insufflated and under direct vision 2 midline 5 mm ports were placed.  Dissection was performed to delineate Cooper's ligament and the lateral extent of dissection was determined on each side. The nerve on the lateral abdominal wall was identified and kept in view at all times. The cord was skeletonized of the indirect sac and cord lipoma which was retracted cephalad on each side.   Once this was complete, a 3D  Mesh by BARD  was placed into the preperitoneal space on each side. They were held in place with the absorbable  tacking device avoiding the area of the nerve. Once assuring that the hernias were completely repaired and adequately covered, the preperitoneal space was desufflated under direct vision. There was no sign of peritoneal rent and no sign of bowel intrusion towards the mesh.  Once assuring that hemostasis was adequate the ports were removed and a figure-of-eight 0 Vicryl suture was placed at the fascial edges. 4-0 subcuticular Monocryl was used at all skin edges. Dermabond was placed.  Patient tolerated the procedure well. There were no complications. He was taken to the recovery room in stable condition.               Sterling Bigiego [name], MD, FACS

## 2016-11-17 NOTE — Anesthesia Procedure Notes (Signed)
Procedure Name: Intubation Date/Time: 11/17/2016 8:46 AM Performed by: Paulette BlanchPARAS, Shane Busic Pre-anesthesia Checklist: Patient identified, Patient being monitored, Timeout performed, Emergency Drugs available and Suction available Patient Re-evaluated:Patient Re-evaluated prior to induction Oxygen Delivery Method: Circle system utilized Preoxygenation: Pre-oxygenation with 100% oxygen Induction Type: IV induction Ventilation: Mask ventilation without difficulty Laryngoscope Size: 3 and Miller Grade View: Grade I Tube type: Oral Tube size: 7.5 mm Number of attempts: 1 Placement Confirmation: ETT inserted through vocal cords under direct vision,  positive ETCO2 and breath sounds checked- equal and bilateral Secured at: 21 cm Tube secured with: Tape Dental Injury: Teeth and Oropharynx as per pre-operative assessment

## 2016-11-17 NOTE — Anesthesia Post-op Follow-up Note (Signed)
Anesthesia QCDR form completed.        

## 2016-11-17 NOTE — Discharge Instructions (Signed)

## 2016-11-17 NOTE — H&P (View-Only) (Signed)
Surgical Consultation  10/19/2016  Shane Dominguez is an 56 y.o. male.   Chief Complaint  Patient presents with  . Other    Est Patient:  Bilateral Ingunial Hernia's     HPI: Comes for evaluation of BIH.He has had bilateral inguinal hernias for several years and is unable to reduce the left side. He experiences intermittent pain especially in the left side that is sharp moderate in intensity. No evidence of bowel obstruction. No previous repairs. CT scan personally reviewed and there is evidence of bilateral inguinal hernias with the left side containing loops of bowel. Of note he did have an appendectomy by me about a month and a half ago and did very well. He has no planes related to his appendectomy.   Past Medical History:  Diagnosis Date  . Acute appendicitis   . Appendicitis 08/18/2016  . GERD (gastroesophageal reflux disease)   . Hepatitis C   . Hypertension     Past Surgical History:  Procedure Laterality Date  . HERNIA REPAIR    . LAPAROSCOPIC APPENDECTOMY N/A 08/18/2016   Procedure: APPENDECTOMY LAPAROSCOPIC;  Surgeon: Leafy RoPabon, Torie Towle F, MD;  Location: [facility] ORS;  Service: General;  Laterality: N/A;    Family History  Problem Relation Age of Onset  . Stroke Mother   . COPD Father   . Emphysema Father   . Stroke Brother     Social History:  reports that he has been smoking.  He has never used smokeless tobacco. He reports that he does not drink alcohol or use drugs.  Allergies: No Known Allergies  Medications reviewed.   ROS Full ROS performed and is otherwise negative other than what is stated in the HPI    BP (!) 164/88   Pulse 67   Temp 98.1 F (36.7 C) (Oral)   Ht 5\' 8"  (1.727 m)   Wt 67 kg (147 lb 9.6 oz)   BMI 22.44 kg/m   Physical Exam  Constitutional: He is oriented to person, place, and time and well-developed, well-nourished, and in no distress. No distress.  Eyes: Right eye exhibits no discharge. Left eye exhibits no discharge. No scleral  icterus.  Neck: Normal range of motion. No JVD present. No tracheal deviation present.  Cardiovascular: Normal rate and intact distal pulses.   Pulmonary/Chest: Effort normal. No respiratory distress.  Abdominal: Soft. He exhibits no distension. There is no tenderness. There is no guarding.  Musculoskeletal: Normal range of motion.  Neurological: He is alert and oriented to person, place, and time. GCS score is 15.  Skin: Skin is warm and dry. He is not diaphoretic.  Psychiatric: Mood, memory, affect and judgment normal.  Nursing note and vitals reviewed.  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 I do think that we be reasonable to attempt a laparoscopic repair but there is a good chance that we may end up doing an open repair especially for the left side. Plan for lap BIH repair. I discussed possibility of incarceration, strangulation, enlargement in size over time, and the risk of emergency surgery in the face of strangulation.  Also discussed the risk of surgery including recurrence which can be up to 30% in the case of complex hernias, use of prosthetic materials (mesh) and the increased risk of infxn, post-op infxn and the possible need for re-operation and removal of mesh if used, possibility of post-op SBO or ileus, and the risks of general anesthetic including MI, CVA,  sudden death or even reaction to anesthetic medications. The patient and family understands the risks, any and all questions were answered to the patient's satisfaction.   Kadeen Sroka, MD FACS General Surgeon  

## 2016-11-17 NOTE — Anesthesia Preprocedure Evaluation (Signed)
Anesthesia Evaluation  Patient identified by MRN, date of birth, ID band Patient awake    Reviewed: Allergy & Precautions, H&P , NPO status , Patient's Chart, lab work & pertinent test results, reviewed documented beta blocker date and time   Airway Mallampati: II  TM Distance: >3 FB Neck ROM: full    Dental  (+) Teeth Intact   Pulmonary neg pulmonary ROS, Current Smoker,    Pulmonary exam normal        Cardiovascular hypertension, negative cardio ROS Normal cardiovascular exam Rhythm:regular Rate:Normal     Neuro/Psych negative neurological ROS  negative psych ROS   GI/Hepatic negative GI ROS, Neg liver ROS, GERD  Medicated,(+) Hepatitis -, C  Endo/Other  negative endocrine ROS  Renal/GU negative Renal ROS  negative genitourinary   Musculoskeletal   Abdominal   Peds  Hematology negative hematology ROS (+)   Anesthesia Other Findings Past Medical History: No date: Acute appendicitis 08/18/2016: Appendicitis No date: GERD (gastroesophageal reflux disease)     Comment:  NO MEDS No date: Hepatitis C No date: Hypertension Past Surgical History: 11/18/2016: HERNIA REPAIR 08/18/2016: LAPAROSCOPIC APPENDECTOMY; N/A     Comment:  Procedure: APPENDECTOMY LAPAROSCOPIC;  Surgeon: Leafy RoPabon,               Diego F, MD;  Location: [facility] ORS;  Service: General;                Laterality: N/A; BMI    Body Mass Index:  22.50 kg/m     Reproductive/Obstetrics negative OB ROS                             Anesthesia Physical Anesthesia Plan  ASA: III  Anesthesia Plan: General ETT   Post-op Pain Management:    Induction:   PONV Risk Score and Plan: 3 and Ondansetron, Dexamethasone, Midazolam and Propofol infusion  Airway Management Planned:   Additional Equipment:   Intra-op Plan:   Post-operative Plan:   Informed Consent: I have reviewed the patients History and Physical, chart, labs and  discussed the procedure including the risks, benefits and alternatives for the proposed anesthesia with the patient or authorized representative who has indicated his/her understanding and acceptance.   Dental Advisory Given  Plan Discussed with: CRNA  Anesthesia Plan Comments:         Anesthesia Quick Evaluation

## 2016-11-17 NOTE — Transfer of Care (Signed)
Immediate Anesthesia Transfer of Care Note  Patient: Shane Dominguez  Procedure(s) Performed: Procedure(s): LAPAROSCOPIC BILATERAL INGUINAL HERNIA REPAIR (Bilateral) INSERTION OF MESH (Bilateral)  Patient Location: PACU  Anesthesia Type:General  Level of Consciousness: awake, alert  and oriented  Airway & Oxygen Therapy: Patient Spontanous Breathing and Patient connected to face mask oxygen  Post-op Assessment: Report given to RN and Post -op Vital signs reviewed and stable  Post vital signs: Reviewed and stable  Last Vitals:  Vitals:   11/17/16 0731  BP: (!) 158/92  Pulse: 64  Resp: 14  Temp: 36.9 C  SpO2: 100%    Last Pain:  Vitals:   11/17/16 0731  TempSrc: Oral         Complications: No apparent anesthesia complications

## 2016-11-17 NOTE — Interval H&P Note (Signed)
History and Physical Interval Note:  11/17/2016 7:24 AM  Shane Dominguez  has presented today for surgery, with the diagnosis of BIH  The various methods of treatment have been discussed with the patient and family. After consideration of risks, benefits and other options for treatment, the patient has consented to  Procedure(s): LAPAROSCOPIC BILATERAL INGUINAL HERNIA REPAIR (Bilateral) HERNIA REPAIR INGUINAL ADULT (Bilateral) as a surgical intervention .  The patient's history has been reviewed, patient examined, no change in status, stable for surgery.  I have reviewed the patient's chart and labs.  Questions were answered to the patient's satisfaction.     Diego F Pabon

## 2016-11-21 NOTE — Telephone Encounter (Signed)
Error

## 2016-11-21 NOTE — Anesthesia Postprocedure Evaluation (Signed)
Anesthesia Post Note  Patient: Shane Dominguez  Procedure(s) Performed: Procedure(s) (LRB): LAPAROSCOPIC BILATERAL INGUINAL HERNIA REPAIR (Bilateral) INSERTION OF MESH (Bilateral)  Patient location during evaluation: PACU Anesthesia Type: General Level of consciousness: awake and alert Pain management: pain level controlled Vital Signs Assessment: post-procedure vital signs reviewed and stable Respiratory status: spontaneous breathing, nonlabored ventilation, respiratory function stable and patient connected to nasal cannula oxygen Cardiovascular status: blood pressure returned to baseline and stable Postop Assessment: no apparent nausea or vomiting Anesthetic complications: no     Last Vitals:  Vitals:   11/17/16 1202 11/17/16 1224  BP: (!) 159/85 (!) 146/82  Pulse: 62 63  Resp: 14 14  Temp: (!) 36.3 C 36.4 C  SpO2: 99% 100%    Last Pain:  Vitals:   11/18/16 0815  TempSrc:   PainSc: 0-No pain                 Yevette Edwards

## 2016-11-22 ENCOUNTER — Telehealth: Payer: Self-pay

## 2016-11-22 NOTE — Telephone Encounter (Signed)
  Tried calling patient at this time regarding post op questions. However his voice mail has not been set up, unable to leave a message at this time.

## 2016-12-05 ENCOUNTER — Ambulatory Visit (INDEPENDENT_AMBULATORY_CARE_PROVIDER_SITE_OTHER): Payer: Medicaid Other | Admitting: Surgery

## 2016-12-05 ENCOUNTER — Encounter: Payer: Self-pay | Admitting: Surgery

## 2016-12-05 VITALS — BP 140/80 | HR 80 | Temp 97.6°F | Wt 144.0 lb

## 2016-12-05 DIAGNOSIS — Z09 Encounter for follow-up examination after completed treatment for conditions other than malignant neoplasm: Secondary | ICD-10-CM

## 2016-12-05 NOTE — Progress Notes (Signed)
S/p lap BIH repair 9/13 Doing well No surgical issues Did not follow lifting restrictions and lifted some groceries    PE NAD Abd: soft, NT, incisions c/d/i, no recurrence. Mild Edema on the right side  A/p Doing well Instructed again about lifting restrictions to decrease chances of recurrence ( 6 wks total), he understands No surgical complications  RTC prn

## 2016-12-05 NOTE — Patient Instructions (Signed)

## 2016-12-12 ENCOUNTER — Ambulatory Visit: Payer: Medicaid Other | Admitting: Gastroenterology

## 2016-12-13 ENCOUNTER — Ambulatory Visit: Payer: Medicaid Other | Admitting: Gastroenterology

## 2017-02-01 ENCOUNTER — Ambulatory Visit (INDEPENDENT_AMBULATORY_CARE_PROVIDER_SITE_OTHER): Payer: Medicaid Other | Admitting: Surgery

## 2017-02-01 ENCOUNTER — Encounter: Payer: Self-pay | Admitting: Surgery

## 2017-02-01 VITALS — BP 163/95 | HR 76 | Temp 98.3°F | Wt 150.0 lb

## 2017-02-01 DIAGNOSIS — Z09 Encounter for follow-up examination after completed treatment for conditions other than malignant neoplasm: Secondary | ICD-10-CM

## 2017-02-01 NOTE — Patient Instructions (Signed)
Please give us a call in case you have any questions or concerns.  

## 2017-02-01 NOTE — Progress Notes (Signed)
S/p lap BIH repair Doing well, ? Knot on right side.  PE NAD Abd: soft, nt, incisions healed, no recurrence  A/p Doing well reassure him about benign findings RTC prn

## 2017-06-01 ENCOUNTER — Telehealth: Payer: Self-pay

## 2017-06-01 ENCOUNTER — Encounter: Payer: Self-pay | Admitting: Surgery

## 2017-06-01 ENCOUNTER — Ambulatory Visit (INDEPENDENT_AMBULATORY_CARE_PROVIDER_SITE_OTHER): Payer: Medicaid Other | Admitting: Surgery

## 2017-06-01 VITALS — BP 167/101 | HR 97 | Temp 98.5°F

## 2017-06-01 DIAGNOSIS — R1031 Right lower quadrant pain: Secondary | ICD-10-CM | POA: Diagnosis not present

## 2017-06-01 NOTE — Patient Instructions (Signed)
Please apply ice on your right groin and take anti-inflammatories to help you with the pain and soreness.

## 2017-06-01 NOTE — Progress Notes (Signed)
S/p Lap BIH 8/18 He was shoveling and felt a pull in his right groin. Some discomfort but no pain.  No bulging  PE NAD Abd: soft, nt, no evidence of recurrent hernias. No infection or peritonitis  A/P Likely musculoskeletal injury from strenuous activity, no evidence of recurrence  May use NSAID and ice RTC prn

## 2017-06-01 NOTE — Telephone Encounter (Signed)
Patient's partner Lynden Ang(Cathy) called stating that the patient believes that he could have a recurrent inguinal hernia. He was lifting something heavy about two weeks ago and felt the pain. He has been taking pain medications for the pain but has not helped. Cathy wanted Dr. Everlene FarrierPabon to see him. I told her that he could be seen today at 3:00 PM. She stated that she will call Rishith to let him know about the appointment.

## 2017-08-14 ENCOUNTER — Emergency Department
Admission: EM | Admit: 2017-08-14 | Discharge: 2017-08-14 | Disposition: A | Discharge status: Home / Self Care | Payer: Medicaid Other | Attending: Student in an Organized Health Care Education/Training Program | Admitting: Student in an Organized Health Care Education/Training Program

## 2017-08-14 ENCOUNTER — Other Ambulatory Visit: Payer: Self-pay

## 2017-08-14 ENCOUNTER — Encounter: Payer: Self-pay | Admitting: Emergency Medicine

## 2017-08-14 DIAGNOSIS — F17210 Nicotine dependence, cigarettes, uncomplicated: Secondary | ICD-10-CM | POA: Insufficient documentation

## 2017-08-14 DIAGNOSIS — L0201 Cutaneous abscess of face: Secondary | ICD-10-CM | POA: Insufficient documentation

## 2017-08-14 DIAGNOSIS — R220 Localized swelling, mass and lump, head: Secondary | ICD-10-CM | POA: Diagnosis present

## 2017-08-14 DIAGNOSIS — I10 Essential (primary) hypertension: Secondary | ICD-10-CM | POA: Diagnosis not present

## 2017-08-14 DIAGNOSIS — L0291 Cutaneous abscess, unspecified: Secondary | ICD-10-CM

## 2017-08-14 MED ORDER — LIDOCAINE HCL (PF) 1 % IJ SOLN
5.0000 mL | Freq: Once | INTRAMUSCULAR | Status: AC
  Administered 2017-08-14: 5 mL via INTRADERMAL
  Filled 2017-08-14: qty 5

## 2017-08-14 MED ORDER — OXYCODONE-ACETAMINOPHEN 5-325 MG PO TABS
1.0000 | ORAL_TABLET | Freq: Once | ORAL | Status: AC
  Administered 2017-08-14: 1 via ORAL
  Filled 2017-08-14: qty 1

## 2017-08-14 NOTE — Discharge Instructions (Signed)
Continue Keflex

## 2017-08-14 NOTE — ED Provider Notes (Signed)
[facility] Emergency Department Provider Note  ____________________________________________  Time seen: Approximately 3:10 PM  I have reviewed the triage vital signs and the nursing notes.   HISTORY  Chief Complaint Abscess    HPI Shane Dominguez is a 57 y.o. male that presents to the emergency department for evaluation of painful area of swelling to chin for 1 week.  [facility] to [facility] Friday and was given a prescription for Keflex.  Symptoms are not improving.  No fever, chills.   Past Medical History:  Diagnosis Date  . Acute appendicitis   . Appendicitis 08/18/2016  . GERD (gastroesophageal reflux disease)    NO MEDS  . Hepatitis C   . Hypertension     Patient Active Problem List   Diagnosis Date Noted  . Bilateral inguinal hernia without obstruction or gangrene 09/01/2016  . GERD (gastroesophageal reflux disease) 09/01/2016  . Appendicitis 08/18/2016  . Acute appendicitis     Past Surgical History:  Procedure Laterality Date  . HERNIA REPAIR  11/18/2016  . INGUINAL HERNIA REPAIR Bilateral 11/17/2016   Procedure: LAPAROSCOPIC BILATERAL INGUINAL HERNIA REPAIR;  Surgeon: Leafy RoPabon, Diego F, MD;  Location: [facility] ORS;  Service: General;  Laterality: Bilateral;  . INSERTION OF MESH Bilateral 11/17/2016   Procedure: INSERTION OF MESH;  Surgeon: Leafy RoPabon, Diego F, MD;  Location: [facility] ORS;  Service: General;  Laterality: Bilateral;  . LAPAROSCOPIC APPENDECTOMY N/A 08/18/2016   Procedure: APPENDECTOMY LAPAROSCOPIC;  Surgeon: Leafy RoPabon, Diego F, MD;  Location: [facility] ORS;  Service: General;  Laterality: N/A;    Prior to Admission medications   Medication Sig Start Date End Date Taking? Authorizing Provider  Multiple Vitamin (MULTIVITAMIN) tablet Take 1 tablet by mouth daily.    [provider]    Allergies Patient has no known allergies.  Family History  Problem Relation Age of Onset  . Stroke Mother   . COPD Father   . Emphysema  Father   . Stroke Brother     Social History Social History   Tobacco Use  . Smoking status: Current Every Day Smoker  . Smokeless tobacco: Never Used  Substance Use Topics  . Alcohol use: No  . Drug use: No     Review of Systems  Constitutional: No fever/chills Gastrointestinal: No abdominal pain.  No nausea, no vomiting.  Musculoskeletal: Negative for musculoskeletal pain. Skin: Negative for abrasions, lacerations, ecchymosis.  Positive for chin pain. Neurological: Negative for headaches, numbness or tingling   ____________________________________________   PHYSICAL EXAM:  VITAL SIGNS: ED Triage Vitals  Enc Vitals Group     BP 08/14/17 1355 (!) 167/96     Pulse Rate 08/14/17 1355 77     Resp 08/14/17 1355 18     Temp 08/14/17 1355 98.2 F (36.8 C)     Temp Source 08/14/17 1355 Oral     SpO2 08/14/17 1355 100 %     Weight 08/14/17 1357 148 lb (67.1 kg)     Height 08/14/17 1357 6\' 1"  (1.854 m)     Head Circumference --      Peak Flow --      Pain Score 08/14/17 1356 10     Pain Loc --      Pain Edu? --      Excl. in GC? --      Constitutional: Alert and oriented. Well appearing and in no acute distress. Eyes: Conjunctivae are normal. PERRL. EOMI. Head: Atraumatic. ENT:      Ears:  Nose: No congestion/rhinnorhea.      Mouth/Throat: Mucous membranes are moist.  Neck: No stridor.  Cardiovascular: Normal rate, regular rhythm.  Good peripheral circulation. Respiratory: Normal respiratory effort without tachypnea or retractions. Lungs CTAB. Good air entry to the bases with no decreased or absent breath sounds. Musculoskeletal: Full range of motion to all extremities. No gross deformities appreciated. Neurologic:  Normal speech and language. No gross focal neurologic deficits are appreciated.  Skin:  Skin is warm, dry and intact. 1cm by 1cm area of swelling and fluctuance to right chin with surrounding induration and erythema. Psychiatric: Mood and affect  are normal. Speech and behavior are normal. Patient exhibits appropriate insight and judgement.   ____________________________________________   LABS (all labs ordered are listed, but only abnormal results are displayed)  Labs Reviewed - No data to display ____________________________________________  EKG   ____________________________________________  RADIOLOGY   No results found.  ____________________________________________    PROCEDURES  Procedure(s) performed:    Procedures  INCISION AND DRAINAGE Performed by: Enid Derry Consent: Verbal consent obtained. Risks and benefits: risks, benefits and alternatives were discussed Type: abscess  Body area: chin  Anesthesia: local infiltration  Incision was made with a scalpel.  Local anesthetic: lidocaine 1 % without epinephrine  Anesthetic total: 2 ml  Complexity: complex Blunt dissection to break up loculations  Drainage: purulent  Drainage amount: 2ccs  Packing material: 1/4 in iodoform gauze  Patient tolerance: Patient tolerated the procedure well with no immediate complications.    Medications  lidocaine (PF) (XYLOCAINE) 1 % injection 5 mL (5 mLs Intradermal Given 08/14/17 1530)  oxyCODONE-acetaminophen (PERCOCET/ROXICET) 5-325 MG per tablet 1 tablet (1 tablet Oral Given 08/14/17 1530)     ____________________________________________   INITIAL IMPRESSION / ASSESSMENT AND PLAN / ED COURSE  Pertinent labs & imaging results that were available during my care of the patient were reviewed by me and considered in my medical decision making (see chart for details).  Review of the [place] CSRS was performed in accordance of the [facility] prior to dispensing any controlled drugs.     Patient's diagnosis is consistent with abscess. Vital signs and exam are reassuring. Patient has a prescription for keflex and will continue. Patient is to follow up with PCP as directed. Patient is given ED precautions to  return to the ED for any worsening or new symptoms.     ____________________________________________  FINAL CLINICAL IMPRESSION(S) / ED DIAGNOSES  Final diagnoses:  Abscess      NEW MEDICATIONS STARTED DURING THIS VISIT:  ED Discharge Orders    None          This chart was dictated using voice recognition software/Dragon. Despite best efforts to proofread, errors can occur which can change the meaning. Any change was purely unintentional.    Enid Derry, PA-C 08/14/17 1637    Merrily Brittle, MD 08/14/17 [phone]

## 2017-08-14 NOTE — ED Triage Notes (Addendum)
Abscess chin x 2 weeks, states started as ingrown hair. States is currently on antibiotics but wonders if it needs to be "lanced".

## 2017-08-14 NOTE — ED Notes (Signed)
See triage note  States he developed a possible abscess to chin couple of weeks ago   Was placed on antibiotic  But area is larger

## 2017-12-19 ENCOUNTER — Observation Stay
Admission: EM | Admit: 2017-12-19 | Discharge: 2017-12-20 | Disposition: A | Discharge status: Home / Self Care | Payer: Medicaid Other | Attending: Internal Medicine | Admitting: Internal Medicine

## 2017-12-19 ENCOUNTER — Emergency Department: Payer: Medicaid Other

## 2017-12-19 ENCOUNTER — Encounter: Payer: Self-pay | Admitting: Emergency Medicine

## 2017-12-19 ENCOUNTER — Other Ambulatory Visit: Payer: Self-pay

## 2017-12-19 DIAGNOSIS — Z823 Family history of stroke: Secondary | ICD-10-CM | POA: Insufficient documentation

## 2017-12-19 DIAGNOSIS — E876 Hypokalemia: Secondary | ICD-10-CM | POA: Diagnosis not present

## 2017-12-19 DIAGNOSIS — Z8249 Family history of ischemic heart disease and other diseases of the circulatory system: Secondary | ICD-10-CM | POA: Insufficient documentation

## 2017-12-19 DIAGNOSIS — R0789 Other chest pain: Secondary | ICD-10-CM

## 2017-12-19 DIAGNOSIS — Z8619 Personal history of other infectious and parasitic diseases: Secondary | ICD-10-CM | POA: Diagnosis not present

## 2017-12-19 DIAGNOSIS — I160 Hypertensive urgency: Principal | ICD-10-CM | POA: Insufficient documentation

## 2017-12-19 DIAGNOSIS — K219 Gastro-esophageal reflux disease without esophagitis: Secondary | ICD-10-CM | POA: Diagnosis not present

## 2017-12-19 DIAGNOSIS — F17210 Nicotine dependence, cigarettes, uncomplicated: Secondary | ICD-10-CM | POA: Insufficient documentation

## 2017-12-19 DIAGNOSIS — R079 Chest pain, unspecified: Secondary | ICD-10-CM

## 2017-12-19 DIAGNOSIS — I10 Essential (primary) hypertension: Secondary | ICD-10-CM

## 2017-12-19 LAB — CBC
HCT: 44 % (ref 39.0–52.0)
Hemoglobin: 15.2 g/dL (ref 13.0–17.0)
MCH: 33.4 pg (ref 26.0–34.0)
MCHC: 34.5 g/dL (ref 30.0–36.0)
MCV: 96.7 fL (ref 80.0–100.0)
NRBC: 0 % (ref 0.0–0.2)
Platelets: 227 10*3/uL (ref 150–400)
RBC: 4.55 MIL/uL (ref 4.22–5.81)
RDW: 12.2 % (ref 11.5–15.5)
WBC: 5.9 10*3/uL (ref 4.0–10.5)

## 2017-12-19 LAB — BASIC METABOLIC PANEL
Anion gap: 5 (ref 5–15)
BUN: 13 mg/dL (ref 6–20)
CHLORIDE: 102 mmol/L (ref 98–111)
CO2: 31 mmol/L (ref 22–32)
Calcium: 8.8 mg/dL — ABNORMAL LOW (ref 8.9–10.3)
Creatinine, Ser: 0.9 mg/dL (ref 0.61–1.24)
GFR calc non Af Amer: >60 mL/min (ref >60)
Glucose, Bld: 78 mg/dL (ref 70–99)
POTASSIUM: 3.2 mmol/L — AB (ref 3.5–5.1)
Sodium: 138 mmol/L (ref 135–145)

## 2017-12-19 LAB — LIPASE, BLOOD: Lipase: 48 U/L (ref 11–51)

## 2017-12-19 LAB — TROPONIN I: Troponin I: <0.03 ng/mL (ref <0.03)

## 2017-12-19 MED ORDER — AMLODIPINE BESYLATE 5 MG PO TABS
5.0000 mg | ORAL_TABLET | Freq: Once | ORAL | Status: AC
  Administered 2017-12-19: 5 mg via ORAL
  Filled 2017-12-19: qty 1

## 2017-12-19 MED ORDER — NITROGLYCERIN 0.4 MG SL SUBL: 0.4000 mg | SUBLINGUAL_TABLET | SUBLINGUAL | Status: DC | PRN

## 2017-12-19 MED ORDER — NITROGLYCERIN 2 % TD OINT
1.0000 [in_us] | TOPICAL_OINTMENT | Freq: Once | TRANSDERMAL | Status: AC
  Administered 2017-12-19: 1 [in_us] via TOPICAL
  Filled 2017-12-19: qty 1

## 2017-12-19 MED ORDER — ASPIRIN 81 MG PO CHEW
324.0000 mg | CHEWABLE_TABLET | Freq: Once | ORAL | Status: AC
  Administered 2017-12-19: 324 mg via ORAL
  Filled 2017-12-19: qty 4

## 2017-12-19 MED ORDER — SUCRALFATE 1 G PO TABS
1.0000 g | ORAL_TABLET | Freq: Once | ORAL | Status: AC
  Administered 2017-12-19: 1 g via ORAL
  Filled 2017-12-19: qty 1

## 2017-12-19 NOTE — ED Triage Notes (Signed)
Pt reports that he was at home, developed some acid reflux, called EMS and they checked his blood pressure and told him it was elevated. Pt refused to come thought it would get better. States that he took Benicar and drank soda to help. Family brought pt in to be checked out.

## 2017-12-19 NOTE — ED Notes (Signed)
Pt states no pain right now really

## 2017-12-19 NOTE — ED Notes (Signed)
Report given to Rebecca RN.

## 2017-12-19 NOTE — ED Provider Notes (Signed)
[facility] Emergency Department Provider Note    First MD Initiated Contact with Patient 12/19/17 2159     (approximate)  I have reviewed the triage vital signs and the nursing notes.   HISTORY  Chief Complaint Hypertension and Gastroesophageal Reflux    HPI Shane Dominguez is a 57 y.o. male with a history of reflux, significant smoking history, hypertension and family history of CAD presents the ER for 1 day of epigastric pain radiating up into his neck and midsternal chest pressure as if something were sitting on his chest associated with diaphoresis and clammy appearance that occurred around 5 PM witnessed by his wife.  Has never had pain like this before.  Initially thought it was just related to heartburn and reflux.  Was checked by first responders and told his blood pressure is elevated initially decided not to come to the ER but after talking with his family was encouraged to be evaluated.    Past Medical History:  Diagnosis Date  . Acute appendicitis   . Appendicitis 08/18/2016  . GERD (gastroesophageal reflux disease)    NO MEDS  . Hepatitis C   . Hypertension    Family History  Problem Relation Age of Onset  . Stroke Mother   . COPD Father   . Emphysema Father   . Stroke Brother    Past Surgical History:  Procedure Laterality Date  . HERNIA REPAIR  11/18/2016  . INGUINAL HERNIA REPAIR Bilateral 11/17/2016   Procedure: LAPAROSCOPIC BILATERAL INGUINAL HERNIA REPAIR;  Surgeon: Leafy Ro, MD;  Location: [facility] ORS;  Service: General;  Laterality: Bilateral;  . INSERTION OF MESH Bilateral 11/17/2016   Procedure: INSERTION OF MESH;  Surgeon: Leafy Ro, MD;  Location: [facility] ORS;  Service: General;  Laterality: Bilateral;  . LAPAROSCOPIC APPENDECTOMY N/A 08/18/2016   Procedure: APPENDECTOMY LAPAROSCOPIC;  Surgeon: Leafy Ro, MD;  Location: [facility] ORS;  Service: General;  Laterality: N/A;   Patient Active Problem List   Diagnosis Date  Noted  . Bilateral inguinal hernia without obstruction or gangrene 09/01/2016  . GERD (gastroesophageal reflux disease) 09/01/2016  . Appendicitis 08/18/2016  . Acute appendicitis       Prior to Admission medications   Medication Sig Start Date End Date Taking? Authorizing Provider  Multiple Vitamin (MULTIVITAMIN) tablet Take 1 tablet by mouth daily.    [provider]    Allergies Patient has no known allergies.    Social History Social History   Tobacco Use  . Smoking status: Current Every Day Smoker  . Smokeless tobacco: Never Used  Substance Use Topics  . Alcohol use: No  . Drug use: No    Review of Systems Patient denies headaches, rhinorrhea, blurry vision, numbness, shortness of breath, chest pain, edema, cough, abdominal pain, nausea, vomiting, diarrhea, dysuria, fevers, rashes or hallucinations unless otherwise stated above in HPI. ____________________________________________   PHYSICAL EXAM:  VITAL SIGNS: Vitals:   12/19/17 2212 12/19/17 2247  BP: (!) 161/120 (!) 177/120  Pulse: 75 67  Resp:  20  Temp:    SpO2: 99% 98%    Constitutional: Alert and oriented.  Eyes: Conjunctivae are normal.  Head: Atraumatic. Nose: No congestion/rhinnorhea. Mouth/Throat: Mucous membranes are moist.   Neck: No stridor. Painless ROM.  Cardiovascular: Normal rate, regular rhythm. Grossly normal heart sounds.  Good peripheral circulation. Respiratory: Normal respiratory effort.  No retractions. Lungs CTAB. Gastrointestinal: Soft and nontender. No distention. No abdominal bruits. No CVA tenderness. Genitourinary:  Musculoskeletal: No  lower extremity tenderness nor edema.  No joint effusions. Neurologic:  Normal speech and language. No gross focal neurologic deficits are appreciated. No facial droop Skin:  Skin is warm, dry and intact. No rash noted. Psychiatric: Mood and affect are normal. Speech and behavior are  normal.  ____________________________________________   LABS (all labs ordered are listed, but only abnormal results are displayed)  Results for orders placed or performed during the hospital encounter of 12/19/17 (from the past 24 hour(s))  Basic metabolic panel     Status: Abnormal   Collection Time: 12/19/17  7:21 PM  Result Value Ref Range   Sodium 138 135 - 145 mmol/L   Potassium 3.2 (L) 3.5 - 5.1 mmol/L   Chloride 102 98 - 111 mmol/L   CO2 31 22 - 32 mmol/L   Glucose, Bld 78 70 - 99 mg/dL   BUN 13 6 - 20 mg/dL   Creatinine, Ser 8.11 0.61 - 1.24 mg/dL   Calcium 8.8 (L) 8.9 - 10.3 mg/dL   GFR calc non Af Amer >60 >60 mL/min   GFR calc Af Amer >60 >60 mL/min   Anion gap 5 5 - 15  CBC     Status: None   Collection Time: 12/19/17  7:21 PM  Result Value Ref Range   WBC 5.9 4.0 - 10.5 K/uL   RBC 4.55 4.22 - 5.81 MIL/uL   Hemoglobin 15.2 13.0 - 17.0 g/dL   HCT 91.4 78.2 - 95.6 %   MCV 96.7 80.0 - 100.0 fL   MCH 33.4 26.0 - 34.0 pg   MCHC 34.5 30.0 - 36.0 g/dL   RDW 21.3 08.6 - 57.8 %   Platelets 227 150 - 400 K/uL   nRBC 0.0 0.0 - 0.2 %  Troponin I     Status: None   Collection Time: 12/19/17  7:21 PM  Result Value Ref Range   Troponin I <0.03 <0.03 ng/mL  Lipase, blood     Status: None   Collection Time: 12/19/17  7:21 PM  Result Value Ref Range   Lipase 48 11 - 51 U/L   ____________________________________________  EKG My review and personal interpretation at Time: 19:17   Indication: chest pain  Rate: 80  Rhythm: sinus Axis: normal Other: normal intervals, j point elevation without reciprocal st depression ____________________________________________  RADIOLOGY  I personally reviewed all radiographic images ordered to evaluate for the above acute complaints and reviewed radiology reports and findings.  These findings were personally discussed with the patient.  Please see medical record for radiology  report.  ____________________________________________   PROCEDURES  Procedure(s) performed:  Procedures    Critical Care performed: no ____________________________________________   INITIAL IMPRESSION / ASSESSMENT AND PLAN / ED COURSE  Pertinent labs & imaging results that were available during my care of the patient were reviewed by me and considered in my medical decision making (see chart for details).   DDX: ACS, pericarditis, esophagitis, boerhaaves, pe, dissection, pna, bronchitis, costochondritis   Shane Dominguez is a 57 y.o. who presents to the ED with symptoms as described above.  Certainly concerning for ACS or unstable angina.  Possible reflux gastritis.  Not consistent with PE.  Chest x-ray is reassuring.  No signs or symptoms to suggest dissection.  Patient with heart score of 5 multiple risk factors without well-established PCP or cardiology.  Based on his presentation will give aspirin nitroglycerin.  Will speak with hospitalist for admission.  Symptoms may be primarily related to hypertensive urgency nevertheless do  feel patient will benefit from further evaluation and cardiac monitoring in the hospital.      As part of my medical decision making, I reviewed the following data within the electronic MEDICAL RECORD NUMBER Nursing notes reviewed and incorporated, Labs reviewed, notes from prior ED visits and [place] Controlled Substance Database   ____________________________________________   FINAL CLINICAL IMPRESSION(S) / ED DIAGNOSES  Final diagnoses:  Chest pain, unspecified type  Hypertensive urgency      NEW MEDICATIONS STARTED DURING THIS VISIT:  New Prescriptions   No medications on file     Note:  This document was prepared using Dragon voice recognition software and may include unintentional dictation errors.    Willy Eddy, MD 12/19/17 [phone]

## 2017-12-19 NOTE — ED Notes (Signed)
Pt up to toilet 

## 2017-12-20 ENCOUNTER — Observation Stay (HOSPITAL_BASED_OUTPATIENT_CLINIC_OR_DEPARTMENT_OTHER)
Admit: 2017-12-20 | Discharge: 2017-12-20 | Disposition: A | Discharge status: Home / Self Care | Payer: Medicaid Other | Attending: Internal Medicine | Admitting: Internal Medicine

## 2017-12-20 ENCOUNTER — Telehealth: Payer: Self-pay | Admitting: Internal Medicine

## 2017-12-20 ENCOUNTER — Encounter: Payer: Self-pay | Admitting: *Deleted

## 2017-12-20 ENCOUNTER — Inpatient Hospital Stay (HOSPITAL_BASED_OUTPATIENT_CLINIC_OR_DEPARTMENT_OTHER): Payer: Medicaid Other

## 2017-12-20 DIAGNOSIS — R0789 Other chest pain: Secondary | ICD-10-CM | POA: Diagnosis not present

## 2017-12-20 DIAGNOSIS — I10 Essential (primary) hypertension: Secondary | ICD-10-CM | POA: Diagnosis not present

## 2017-12-20 DIAGNOSIS — I340 Nonrheumatic mitral (valve) insufficiency: Secondary | ICD-10-CM | POA: Diagnosis not present

## 2017-12-20 LAB — LIPID PANEL
Cholesterol: 131 mg/dL (ref 0–200)
HDL: 35 mg/dL — ABNORMAL LOW (ref >40)
LDL CALC: 85 mg/dL (ref 0–99)
Total CHOL/HDL Ratio: 3.7 RATIO
Triglycerides: 57 mg/dL (ref <150)
VLDL: 11 mg/dL (ref 0–40)

## 2017-12-20 LAB — BASIC METABOLIC PANEL
Anion gap: 9 (ref 5–15)
BUN: 10 mg/dL (ref 6–20)
CO2: 26 mmol/L (ref 22–32)
Calcium: 8.6 mg/dL — ABNORMAL LOW (ref 8.9–10.3)
Chloride: 103 mmol/L (ref 98–111)
Creatinine, Ser: 0.66 mg/dL (ref 0.61–1.24)
GFR calc Af Amer: >60 mL/min (ref >60)
GFR calc non Af Amer: >60 mL/min (ref >60)
GLUCOSE: 93 mg/dL (ref 70–99)
POTASSIUM: 3.5 mmol/L (ref 3.5–5.1)
SODIUM: 138 mmol/L (ref 135–145)

## 2017-12-20 LAB — CBC
HEMATOCRIT: 43.7 % (ref 39.0–52.0)
HEMOGLOBIN: 15 g/dL (ref 13.0–17.0)
MCH: 32.8 pg (ref 26.0–34.0)
MCHC: 34.3 g/dL (ref 30.0–36.0)
MCV: 95.6 fL (ref 80.0–100.0)
Platelets: 200 10*3/uL (ref 150–400)
RBC: 4.57 MIL/uL (ref 4.22–5.81)
RDW: 12.1 % (ref 11.5–15.5)
WBC: 4.5 10*3/uL (ref 4.0–10.5)
nRBC: 0 % (ref 0.0–0.2)

## 2017-12-20 LAB — NM MYOCAR MULTI W/SPECT W/WALL MOTION / EF
CHL CUP RESTING HR STRESS: 73 {beats}/min
CSEPPHR: 110 {beats}/min
Estimated workload: 1 METS
Exercise duration (min): 0 min
Exercise duration (sec): 0 s
LV dias vol: 80 mL (ref 62–150)
LV sys vol: 32 mL
MPHR: 161 {beats}/min
Percent HR: 68 %
TID: 0.95

## 2017-12-20 LAB — GLUCOSE, CAPILLARY
GLUCOSE-CAPILLARY: 83 mg/dL (ref 70–99)
Glucose-Capillary: 109 mg/dL — ABNORMAL HIGH (ref 70–99)

## 2017-12-20 LAB — TROPONIN I

## 2017-12-20 LAB — ECHOCARDIOGRAM COMPLETE
Height: 73 in
WEIGHTICAEL: 2148.8 [oz_av]

## 2017-12-20 MED ORDER — HYDROCODONE-ACETAMINOPHEN 5-325 MG PO TABS: 1.0000 | ORAL_TABLET | ORAL | Status: DC | PRN

## 2017-12-20 MED ORDER — PANTOPRAZOLE SODIUM 40 MG PO TBEC
40.0000 mg | DELAYED_RELEASE_TABLET | Freq: Every day | ORAL | Status: DC
  Administered 2017-12-20: 40 mg via ORAL
  Filled 2017-12-20: qty 1

## 2017-12-20 MED ORDER — REGADENOSON 0.4 MG/5ML IV SOLN
0.4000 mg | Freq: Once | INTRAVENOUS | Status: AC
  Administered 2017-12-20: 0.4 mg via INTRAVENOUS

## 2017-12-20 MED ORDER — SODIUM CHLORIDE 0.9 % IV SOLN
Freq: Once | INTRAVENOUS | Status: AC
  Administered 2017-12-20: 03:00:00 via INTRAVENOUS

## 2017-12-20 MED ORDER — AMLODIPINE BESYLATE 5 MG PO TABS: 5.0000 mg | ORAL_TABLET | Freq: Every day | ORAL | 0 refills | Status: DC

## 2017-12-20 MED ORDER — ADULT MULTIVITAMIN W/MINERALS CH
1.0000 | ORAL_TABLET | Freq: Every day | ORAL | Status: DC
  Administered 2017-12-20: 1 via ORAL
  Filled 2017-12-20: qty 1

## 2017-12-20 MED ORDER — TECHNETIUM TC 99M TETROFOSMIN IV KIT
10.0000 | PACK | Freq: Once | INTRAVENOUS | Status: AC | PRN
  Administered 2017-12-20: 10.258 via INTRAVENOUS

## 2017-12-20 MED ORDER — POTASSIUM CHLORIDE CRYS ER 20 MEQ PO TBCR: 20.0000 meq | EXTENDED_RELEASE_TABLET | Freq: Two times a day (BID) | ORAL | Status: DC

## 2017-12-20 MED ORDER — PANTOPRAZOLE SODIUM 40 MG PO TBEC: 40.0000 mg | DELAYED_RELEASE_TABLET | Freq: Every day | ORAL | 0 refills | Status: DC

## 2017-12-20 MED ORDER — ASPIRIN 81 MG PO TBEC: 81.0000 mg | DELAYED_RELEASE_TABLET | Freq: Every day | ORAL | 0 refills | Status: DC

## 2017-12-20 MED ORDER — ASPIRIN EC 81 MG PO TBEC
81.0000 mg | DELAYED_RELEASE_TABLET | Freq: Every day | ORAL | Status: DC
  Administered 2017-12-20: 81 mg via ORAL
  Filled 2017-12-20: qty 1

## 2017-12-20 MED ORDER — ALUM & MAG HYDROXIDE-SIMETH 200-200-20 MG/5ML PO SUSP
30.0000 mL | Freq: Once | ORAL | Status: AC
  Administered 2017-12-20: 30 mL via ORAL
  Filled 2017-12-20: qty 30

## 2017-12-20 MED ORDER — BISACODYL 5 MG PO TBEC: 5.0000 mg | DELAYED_RELEASE_TABLET | Freq: Every day | ORAL | Status: DC | PRN

## 2017-12-20 MED ORDER — AMLODIPINE BESYLATE 5 MG PO TABS
5.0000 mg | ORAL_TABLET | Freq: Every day | ORAL | Status: DC
  Administered 2017-12-20: 5 mg via ORAL
  Filled 2017-12-20 (×2): qty 1

## 2017-12-20 MED ORDER — AMLODIPINE BESYLATE 10 MG PO TABS: 10.0000 mg | ORAL_TABLET | Freq: Every day | ORAL | 11 refills | Status: DC

## 2017-12-20 MED ORDER — ONDANSETRON HCL 4 MG/2ML IJ SOLN: 4.0000 mg | Freq: Four times a day (QID) | INTRAMUSCULAR | Status: DC | PRN

## 2017-12-20 MED ORDER — DOCUSATE SODIUM 100 MG PO CAPS: 100.0000 mg | ORAL_CAPSULE | Freq: Two times a day (BID) | ORAL | Status: DC

## 2017-12-20 MED ORDER — ACETAMINOPHEN 325 MG PO TABS
650.0000 mg | ORAL_TABLET | Freq: Four times a day (QID) | ORAL | Status: DC | PRN
  Filled 2017-12-20: qty 2

## 2017-12-20 MED ORDER — ACETAMINOPHEN 650 MG RE SUPP: 650.0000 mg | Freq: Four times a day (QID) | RECTAL | Status: DC | PRN

## 2017-12-20 MED ORDER — HEPARIN SODIUM (PORCINE) 5000 UNIT/ML IJ SOLN
5000.0000 [IU] | Freq: Three times a day (TID) | INTRAMUSCULAR | Status: DC
  Administered 2017-12-20 (×2): 5000 [IU] via SUBCUTANEOUS
  Filled 2017-12-20 (×2): qty 1

## 2017-12-20 MED ORDER — ONDANSETRON HCL 4 MG PO TABS: 4.0000 mg | ORAL_TABLET | Freq: Four times a day (QID) | ORAL | Status: DC | PRN

## 2017-12-20 MED ORDER — TECHNETIUM TC 99M TETROFOSMIN IV KIT
32.5230 | PACK | Freq: Once | INTRAVENOUS | Status: AC | PRN
  Administered 2017-12-20: 32.523 via INTRAVENOUS

## 2017-12-20 NOTE — H&P (Signed)
[facility] - [place] at [facility]   PATIENT NAME: Shane Dominguez    MR#:  [id number]  DATE OF BIRTH:  01/10/1961  DATE OF ADMISSION:  12/19/2017  PRIMARY CARE PHYSICIAN: Patient, No Pcp Per   REQUESTING/REFERRING PHYSICIAN:   CHIEF COMPLAINT:   Chief Complaint  Patient presents with  . Hypertension  . Gastroesophageal Reflux    HISTORY OF PRESENT ILLNESS: Shane Dominguez  is a 57 y.o. male with a known history of tobacco abuse, hepatitis C and hypertension.  Patient has significant family history of coronary artery disease. He presented to emergency room for epigastric pain and retrosternal chest pain, described as moderate pressure, radiating to his neck.  He was noted by the family to be diaphoretic and clammy.  His symptoms started 3 to 4 hours before arrival to emergency room.  She denies having similar episodes in the past.  He believes he might be due to acid reflux disease.  No cough, fever, chills, nausea, vomiting, diarrhea, bleeding. Blood test done emergency room including troponin level, CBC and CMP are grossly unremarkable. No active cardiopulmonary disease per chest x-ray. Patient is admitted for further evaluation and treatment.  PAST MEDICAL HISTORY:   Past Medical History:  Diagnosis Date  . Acute appendicitis   . Appendicitis 08/18/2016  . GERD (gastroesophageal reflux disease)    NO MEDS  . Hepatitis C   . Hypertension     PAST SURGICAL HISTORY:  Past Surgical History:  Procedure Laterality Date  . HERNIA REPAIR  11/18/2016  . INGUINAL HERNIA REPAIR Bilateral 11/17/2016   Procedure: LAPAROSCOPIC BILATERAL INGUINAL HERNIA REPAIR;  Surgeon: Leafy Ro, MD;  Location: [facility] ORS;  Service: General;  Laterality: Bilateral;  . INSERTION OF MESH Bilateral 11/17/2016   Procedure: INSERTION OF MESH;  Surgeon: Leafy Ro, MD;  Location: [facility] ORS;  Service: General;  Laterality: Bilateral;  . LAPAROSCOPIC APPENDECTOMY N/A 08/18/2016    Procedure: APPENDECTOMY LAPAROSCOPIC;  Surgeon: Leafy Ro, MD;  Location: [facility] ORS;  Service: General;  Laterality: N/A;    SOCIAL HISTORY:  Social History   Tobacco Use  . Smoking status: Current Every Day Smoker  . Smokeless tobacco: Never Used  Substance Use Topics  . Alcohol use: No    FAMILY HISTORY:  Family History  Problem Relation Age of Onset  . Stroke Mother   . COPD Father   . Emphysema Father   . Stroke Brother     DRUG ALLERGIES: No Known Allergies  REVIEW OF SYSTEMS:   CONSTITUTIONAL: No fever, fatigue or weakness.  EYES: No changes in vision.  EARS, NOSE, AND THROAT: No tinnitus or ear pain.  RESPIRATORY: No cough, shortness of breath, wheezing or hemoptysis.  CARDIOVASCULAR: Positive for chest pain, no orthopnea, edema.  GASTROINTESTINAL: No nausea, vomiting, diarrhea or abdominal pain.  GENITOURINARY: No dysuria, hematuria.  ENDOCRINE: No polyuria, nocturia. HEMATOLOGY: No bleeding. SKIN: No rash or lesion. MUSCULOSKELETAL: No joint pain at this time.   NEUROLOGIC: No focal weakness.  PSYCHIATRY: No anxiety or depression.   MEDICATIONS AT HOME:  Prior to Admission medications   Medication Sig Start Date End Date Taking? Authorizing Provider  Multiple Vitamin (MULTIVITAMIN) tablet Take 1 tablet by mouth daily.   Yes [provider]      PHYSICAL EXAMINATION:   VITAL SIGNS: Blood pressure (!) 148/88, pulse 66, temperature 98.4 F (36.9 C), temperature source Oral, resp. rate 19, height 6\' 6"  (1.981 m), weight 63.5 kg, SpO2 96 %.  GENERAL:  57 y.o.-year-old patient lying in the bed with no acute distress.  EYES: Pupils equal, round, reactive to light and accommodation. No scleral icterus. Extraocular muscles intact.  HEENT: Head atraumatic, normocephalic. Oropharynx and nasopharynx clear.  NECK:  Supple, no jugular venous distention. No thyroid enlargement, no tenderness.  LUNGS: Normal breath sounds bilaterally, no wheezing,  rales,rhonchi or crepitation. No use of accessory muscles of respiration.  CARDIOVASCULAR: S1, S2 normal. No S3/S4.  ABDOMEN: Soft, nontender, nondistended. Bowel sounds present. No organomegaly or mass.  EXTREMITIES: No pedal edema, cyanosis, or clubbing.  NEUROLOGIC: Cranial nerves II through XII are intact. Muscle strength 5/5 in all extremities. Sensation intact.   PSYCHIATRIC: The patient is alert and oriented x 3.  SKIN: No obvious rash, lesion, or ulcer.   LABORATORY PANEL:   CBC Recent Labs  Lab 12/19/17 1921  WBC 5.9  HGB 15.2  HCT 44.0  PLT 227  MCV 96.7  MCH 33.4  MCHC 34.5  RDW 12.2   ------------------------------------------------------------------------------------------------------------------  Chemistries  Recent Labs  Lab 12/19/17 1921  NA 138  K 3.2*  CL 102  CO2 31  GLUCOSE 78  BUN 13  CREATININE 0.90  CALCIUM 8.8*   ------------------------------------------------------------------------------------------------------------------ estimated creatinine clearance is 79.4 mL/min (by C-G formula based on SCr of 0.9 mg/dL). ------------------------------------------------------------------------------------------------------------------ No results for input(s): TSH, T4TOTAL, T3FREE, THYROIDAB in the last 72 hours.  Invalid input(s): FREET3   Coagulation profile No results for input(s): INR, PROTIME in the last 168 hours. ------------------------------------------------------------------------------------------------------------------- No results for input(s): DDIMER in the last 72 hours. -------------------------------------------------------------------------------------------------------------------  Cardiac Enzymes Recent Labs  Lab 12/19/17 1921  TROPONINI <0.03   ------------------------------------------------------------------------------------------------------------------ Invalid input(s):  POCBNP  ---------------------------------------------------------------------------------------------------------------  Urinalysis    Component Value Date/Time   COLORURINE AMBER (A) 08/18/2016 1706   APPEARANCEUR CLEAR (A) 08/18/2016 1706   APPEARANCEUR Clear 07/18/2013 0510   LABSPEC 1.027 08/18/2016 1706   LABSPEC 1.020 07/18/2013 0510   PHURINE 5.0 08/18/2016 1706   GLUCOSEU NEGATIVE 08/18/2016 1706   GLUCOSEU Negative 07/18/2013 0510   HGBUR NEGATIVE 08/18/2016 1706   BILIRUBINUR NEGATIVE 08/18/2016 1706   BILIRUBINUR Negative 07/18/2013 0510   KETONESUR NEGATIVE 08/18/2016 1706   PROTEINUR NEGATIVE 08/18/2016 1706   NITRITE NEGATIVE 08/18/2016 1706   LEUKOCYTESUR NEGATIVE 08/18/2016 1706   LEUKOCYTESUR Negative 07/18/2013 0510     RADIOLOGY: Dg Chest 2 View  Result Date: 12/19/2017 CLINICAL DATA:  Elevated blood pressure chest tightness EXAM: CHEST - 2 VIEW COMPARISON:  08/18/2016 FINDINGS: No focal opacity or pleural effusion. Stable cardiomediastinal silhouette with aortic atherosclerosis. No pneumothorax. Hyperinflated lungs. IMPRESSION: No active cardiopulmonary disease. Electronically Signed   By: Jasmine Pang M.D.   On: 12/19/2017 19:43    EKG: Orders placed or performed during the hospital encounter of 12/19/17  . EKG 12-Lead  . EKG 12-Lead  . ED EKG within 10 minutes  . ED EKG within 10 minutes    IMPRESSION AND PLAN:  1.  Chest pain, will rule out acute coronary syndrome.  Continue to monitor patient on telemetry and follow troponin levels.  Will check 2D echo and stress test.  We will check fasting lipid panel.  Cardiology is consulted for further evaluation and treatment. 2.  Tobacco abuse.  Smoking cessation was discussed with patient in detail. 3.  Hypertension, not on any medications at home.  Will start Norvasc and monitor blood pressure closely.  All the records are reviewed and case discussed with ED provider. Management plans discussed with  the patient, family  and they are in agreement.  CODE STATUS: Full Code Status History    Date Active Date Inactive Code Status Order ID Comments User Context   08/18/2016 2119 08/19/2016 1823 Full Code [id number]  Leafy Ro, MD Inpatient       TOTAL TIME TAKING CARE OF THIS PATIENT: 50 minutes.    Cammy Copa M.D on 12/20/2017 at 1:38 AM  Between 7am to 6pm - Pager - [phone]  After 6pm go to [email or web address] - password [email or web address] [facility] [place] at [facility]  [phone]  CC: Primary care physician; Patient, No Pcp Per

## 2017-12-20 NOTE — ED Notes (Addendum)
Patient transported to room 241 by this EDT via w/c.

## 2017-12-20 NOTE — Progress Notes (Signed)
Admitted this morning for atypical chest pain, GERD, elevated blood pressure.  Patient blood pressure was elevated at 183/93 when he came and improved increased to 177/120, admitted to telemetry for evaluation of chest pain, patient started on amlodipine 5 mg daily, nitro patch to help with blood pressure, blood care this morning is better at 110/70, cardiac markers are negative x2 so far.  Patient will get stress test third troponin is negative, if the stress test is negative discharge the patient home with oral antihypertensives. 2.  GERD: Patient is given Carafate,/ #3 history of hep C. 4.  Borderline hypokalemia: Replaced. Likely discharge home today if the stress test is negative. Time spent 30 minutes.

## 2017-12-20 NOTE — Progress Notes (Signed)
*  PRELIMINARY RESULTS* Echocardiogram 2D Echocardiogram has been performed.  Cristela Blue 12/20/2017, 4:15 PM

## 2017-12-20 NOTE — Care Management Note (Signed)
Case Management Note  Patient Details  Name: OWENS HARA MRN: [id number] Date of Birth: 02/23/1961  Subjective/Objective:      From home with chest pain.  Had stress test this morning and echocardiogram this afternoon.  No current PCP.  Would like to follow up with [facility] as he has been there in the past.  Scheduled appointment for  Independent in all adls, denies issues accessing medical care, obtaining medications or with transportation.  No discharge needs identified at present by care manager or members of care team.                  Action/Plan:  RNCM scheduled appointment for PCP with [facility] on November 20th at [facility].    Expected Discharge Date:  12/20/17               Expected Discharge Plan:  Home/Self Care  In-House Referral:     Discharge planning Services  CM Consult  Post Acute Care Choice:    Choice offered to:     DME Arranged:    DME Agency:     HH Arranged:    HH Agency:     Status of Service:  Completed, signed off  If discussed at [employer] of Stay Meetings, dates discussed:    Additional Comments:  Sherren Kerns, RN 12/20/2017, 3:54 PM

## 2017-12-20 NOTE — Telephone Encounter (Signed)
Patient is TCM and is scheduled for 12/29/17 to see Dr End

## 2017-12-20 NOTE — Telephone Encounter (Signed)
-----   Message from [name], MD sent at 12/20/2017  2:50 PM EDT ----- Regarding: Hospital follow-up Hello ladies,  Mr. Shane Dominguez was seen in the hospital today for chest pain and will likely be discharged this afternoon.  Can you arrange for him to be seen by me or an APP in ~2 weeks?  Thanks.  Thayer Ohm

## 2017-12-20 NOTE — Consult Note (Signed)
Cardiology Consultation:   Patient ID: Shane Dominguez MRN: [id number]; DOB: 07-May-1960  Admit date: 12/19/2017 Date of Consult: 12/20/2017  Primary Care Provider: Patient, No Pcp Per  Primary Cardiologist: [facility] - Dr. Okey Dupre Primary Electrophysiologist:  None    Patient Profile:   Shane Dominguez is a 57 y.o. male with no known cardiac disease, elevated BP in ED on 12/20/17, GERD, Hep C, distant inguinal hernia repair and appendectomy, and h/o tobacco abuse who is being seen today for the evaluation of chest pain at the request of Dr. Luberta Mutter.  History of Present Illness:   Shane Dominguez is a 57 year old African-American male with no known h/o cardiac disease. No reported family h/o CAD. Patient also stated he has not seen a primary care doctor or cardiologist "for years." He has documented h/o GERD for which he takes antacids, smokes 1 pack of cigarettes daily, and stated he does not drink alcohol.   He presented to the emergency room on 12/20/2016 due to chest pain that began 10/15 and was at first thought to be his GERD. He reported that he was doing light yard work the [employer] of 10/15 and then, after coming inside for the day, experienced severe CP / pressure after bending down to feed his dog ~ 5PM. His CP was described as located (vaguely) in the region of mid-sternal, epigastric, and left-sided chest. It was reproted as chest tightness/pressure that radiated to the anterior neck & without any associated radiation to the arms. This CP episode reportedly lasted only minutes and was rated as 10/10. No reported associated shortness of breath, nausea, vomiting, diaphoresis or dizziness. No current or previous h/o LEE. Of note, the CP did not reportedly worsen with exertion as he continued to walk and complete his chores while experiencing the CP. No other aggravating or alleviating factors reported, and he was not able to recall if CP was relieved with rest or SL nitroglycerin  received in the [facility] ED.    He reportedly decided to present to the ED after his wife expressed her concern and called EMS. Patient stated that "the next thing he knew, the first responders were everywhere and told him his BVP was elevated." After talking with his family, the patient stated he decided to come to [facility].   In the ED, BP was documented as elevated and improved after administration of  amlodipine.  Vitals: BP 161/120, HR 75, SpO2 99% ORA Labs: Na 138, K 3.2, glucose 78, Cr 0.90, Ca 8.8, WBC 5.9, Hgb 15.2, plts 227, lipase 48, negative troponin EKG: NSR, 80bpm - non-specific ST changes; j point elevation without reciprocal ST depression, poor r wave progression, consider LVH / possible previous inferior septal infarct CXR: No active cardiopulmonary disease  Further labs s/p admission: Negative troponinx3.  Lipids with LDL 85. CMP with K 3.5  No current CP.  Past Medical History:  Diagnosis Date  . Acute appendicitis   . Appendicitis 08/18/2016  . GERD (gastroesophageal reflux disease)    NO MEDS  . Hepatitis C   . Hypertension     Past Surgical History:  Procedure Laterality Date  . HERNIA REPAIR  11/18/2016  . INGUINAL HERNIA REPAIR Bilateral 11/17/2016   Procedure: LAPAROSCOPIC BILATERAL INGUINAL HERNIA REPAIR;  Surgeon: Leafy Ro, MD;  Location: [facility] ORS;  Service: General;  Laterality: Bilateral;  . INSERTION OF MESH Bilateral 11/17/2016   Procedure: INSERTION OF MESH;  Surgeon: Leafy Ro, MD;  Location: [facility] ORS;  Service: General;  Laterality: Bilateral;  . LAPAROSCOPIC APPENDECTOMY N/A 08/18/2016   Procedure: APPENDECTOMY LAPAROSCOPIC;  Surgeon: Leafy Ro, MD;  Location: [facility] ORS;  Service: General;  Laterality: N/A;     Home Medications:  Prior to Admission medications   Medication Sig Start Date End Date Taking? Authorizing Provider  Multiple Vitamin (MULTIVITAMIN) tablet Take 1 tablet by mouth daily.   Yes [provider]  amLODipine  (NORVASC) 5 MG tablet Take 1 tablet (5 mg total) by mouth daily. 12/21/17   Katha Hamming, MD  aspirin EC 81 MG EC tablet Take 1 tablet (81 mg total) by mouth daily. 12/21/17   Katha Hamming, MD  pantoprazole (PROTONIX) 40 MG tablet Take 1 tablet (40 mg total) by mouth daily. 12/21/17   Katha Hamming, MD    Inpatient Medications: Scheduled Meds: . amLODipine  5 mg Oral Daily  . aspirin EC  81 mg Oral Daily  . docusate sodium  100 mg Oral BID  . heparin  5,000 Units Subcutaneous Q8H  . multivitamin with minerals  1 tablet Oral Daily  . pantoprazole  40 mg Oral Daily  . potassium chloride  20 mEq Oral BID   Continuous Infusions:  PRN Meds: acetaminophen **OR** acetaminophen, bisacodyl, HYDROcodone-acetaminophen, nitroGLYCERIN, ondansetron **OR** ondansetron (ZOFRAN) IV  Allergies:   No Known Allergies  Social History:   Social History   Socioeconomic History  . Marital status: Single    Spouse name: Not on file  . Number of children: Not on file  . Years of education: Not on file  . Highest education level: Not on file  Occupational History  . Not on file  Social Needs  . Financial resource strain: Not on file  . Food insecurity:    Worry: Not on file    Inability: Not on file  . Transportation needs:    Medical: Not on file    Non-medical: Not on file  Tobacco Use  . Smoking status: Current Every Day Smoker  . Smokeless tobacco: Never Used  Substance and Sexual Activity  . Alcohol use: No  . Drug use: No  . Sexual activity: Not on file  Lifestyle  . Physical activity:    Days per week: Not on file    Minutes per session: Not on file  . Stress: Not on file  Relationships  . Social connections:    Talks on phone: Not on file    Gets together: Not on file    Attends religious service: Not on file    Active member of club or organization: Not on file    Attends meetings of clubs or organizations: Not on file    Relationship status: Not on  file  . Intimate partner violence:    Fear of current or ex partner: Not on file    Emotionally abused: Not on file    Physically abused: Not on file    Forced sexual activity: Not on file  Other Topics Concern  . Not on file  Social History Narrative  . Not on file    Family History:    Family History  Problem Relation Age of Onset  . Stroke Mother   . COPD Father   . Emphysema Father   . Stroke Brother      ROS:  Please see the history of present illness.   All other ROS reviewed and negative.     Physical Exam/Data:   Vitals:   12/20/17 0255 12/20/17 0800 12/20/17 1300 12/20/17 1559  BP:  118/73 (!) 143/89 137/83  Pulse:  (!) 57 61 (!) 59  Resp:  18 18 18   Temp:  97.9 F (36.6 C) 97.6 F (36.4 C) 98.4 F (36.9 C)  TempSrc:  Oral Oral Oral  SpO2:  100% 100% 100%  Weight: 60.9 kg     Height: 6\' 1"  (1.854 m)       Intake/Output Summary (Last 24 hours) at 31-Dec-2017 1857 Last data filed at 2017/12/31 1700 Gross per 24 hour  Intake 31.54 ml  Output 375 ml  Net -343.46 ml   Filed Weights   12/19/17 1910 Dec 31, 2017 0228 31-Dec-2017 0255  Weight: 63.5 kg 60.9 kg 60.9 kg   Body mass index is 17.72 kg/m.  General:  Well nourished, AA male well developed, in no acute distress HEENT: normal Neck: no JVD Cardiac:  normal S1, S2; RRR; no murmur  Lungs: CTAB, no wheezing, rhonchi or rales  Abd: soft, nontender, no hepatomegaly  Ext: no edema Musculoskeletal:  No deformities, BUE and BLE strength normal and equal Skin: warm and dry  Neuro: no focal abnormalities noted Psych:  Normal affect   EKG: Refer to HPI Telemetry:  Telemetry was personally reviewed and demonstrates:  NSR, HR 60-90  CV Studies:   Relevant CV Studies:  31-Dec-2017 Lexiscan  Abnormal, probably low risk myocardial perfusion stress test  There is a small in size, severe, fixed basal and mid inferoseptal defect consistent with scar but cannot rule out artifact.  There is no significant  ischemia.  The left ventricular ejection fraction is normal(>65%) with normal regional wall motion.  The sensitivity and specificity of the study are degraded by extracardiac activity and diaphragmatic attenuation.  12-31-17  TTE Study Conclusions - Left ventricle: The cavity size was normal. Wall thickness was   normal. Systolic function was normal. The estimated ejection   fraction was in the range of 60% to 65%. Wall motion was normal;   there were no regional wall motion abnormalities. Left   ventricular diastolic function parameters were normal. - Mitral valve: There was mild regurgitation. - Right ventricle: The cavity size was normal. Wall thickness was   normal. Systolic function was normal.   Laboratory Data:  Chemistry Recent Labs  Lab 12/19/17 1921 12-31-17 0531  NA 138 138  K 3.2* 3.5  CL 102 103  CO2 31 26  GLUCOSE 78 93  BUN 13 10  CREATININE 0.90 0.66  CALCIUM 8.8* 8.6*  GFRNONAA >60 >60  GFRAA >60 >60  ANIONGAP 5 9    No results for input(s): PROT, ALBUMIN, AST, ALT, ALKPHOS, BILITOT in the last 168 hours. Hematology Recent Labs  Lab 12/19/17 1921 2017/12/31 0531  WBC 5.9 4.5  RBC 4.55 4.57  HGB 15.2 15.0  HCT 44.0 43.7  MCV 96.7 95.6  MCH 33.4 32.8  MCHC 34.5 34.3  RDW 12.2 12.1  PLT 227 200   Cardiac Enzymes Recent Labs  Lab 12/19/17 1921 December 31, 2017 0531 12/31/17 0837  TROPONINI <0.03 <0.03 <0.03   No results for input(s): TROPIPOC in the last 168 hours.  BNPNo results for input(s): BNP, PROBNP in the last 168 hours.  DDimer No results for input(s): DDIMER in the last 168 hours.  Radiology/Studies:  Dg Chest 2 View  Result Date: 12/19/2017 CLINICAL DATA:  Elevated blood pressure chest tightness EXAM: CHEST - 2 VIEW COMPARISON:  08/18/2016 FINDINGS: No focal opacity or pleural effusion. Stable cardiomediastinal silhouette with aortic atherosclerosis. No pneumothorax. Hyperinflated lungs. IMPRESSION: No active cardiopulmonary disease.  Electronically Signed  By: Jasmine Pang M.D.   On: 12/19/2017 19:43   Nm Myocar Multi W/spect W/wall Motion / Ef  Result Date: 12/20/2017  Abnormal, probably low risk myocardial perfusion stress test  There is a small in size, severe, fixed basal and mid inferoseptal defect consistent with scar but cannot rule out artifact.  There is no significant ischemia.  The left ventricular ejection fraction is normal(>65%) with normal regional wall motion.  The sensitivity and specificity of the study are degraded by extracardiac activity and diaphragmatic attenuation.     Assessment and Plan:   Atypical Angina in the setting of elevated BP at presentation and with low concern for cardiac etiology  - No current CP - Troponin negative x3. EKG without STE.  - Atypical CP with low concern for cardiac etiology. CP ddx includes elevated BP / demand ischemia v GERD etiology. - 10/16 Lexiscan - Abnormal, probably low risk myocardial perfusion stress test. Normal EF small in size, severe, fixed basal and mid inferoseptal defect consistent with scar but cannot rule out artifact. - 10/16 TTE - Mild MR. NWMA. Normal EF - Consider discharge with statin given LDL and risk factors / calculated risk and for risk reduction with follow-up liver and lipid panel following statin initiation - Recommend discharge with ASA 81mg  daily, amlodipine. Consider start of statin prior to discharge or at outpatient follow-up. SL nitro PRN for CP. Also recommend PPI given h/o GERD.  - Recommend outpatient follow-up in [facility] office at 2 week s/p discharge with follow-up labs at that time.  Hypokalemia - K as above in labs section - Replete with goal 4.0 - Consider checking Mg as inpatient or at follow-up - Recheck BMP in AM or at outpatient follow-up  Elevated BP  - Consider follow-up with PCP/ outpatient cardiology to recheck BP, pending official dx of HTN - Continue amlodipine as above - Recommend check BP  at home  Tobacco abuse - Smoking cessation advised    For questions or updates, please contact [facility] Please consult [email or web address] for contact info under     Signed, Lennon Alstrom, PA-C  12/20/2017 6:57 PM

## 2017-12-21 LAB — HIV ANTIBODY (ROUTINE TESTING W REFLEX): HIV Screen 4th Generation wRfx: NONREACTIVE

## 2017-12-21 NOTE — Telephone Encounter (Signed)
Patient contacted regarding discharge from [facility] on Wednesday 12/20/17.  Patient understands to follow up with provider Dr. Okey Dupre on Friday 12/29/17 at 3:40 pm at the [facility] office. Patient understands discharge instructions? Yes Patient understands medications and regiment? Yes Patient understands to bring all medications to this visit? Yes

## 2017-12-25 NOTE — Discharge Summary (Signed)
Shane Dominguez, is a 57 y.o. male  DOB August 15, 1960  MRN [id number].  Admission date:  12/19/2017  Admitting Physician  Cammy Copa, MD  Discharge Date:  12/20/2017   Primary MD  Patient, No Pcp Per  Recommendations for primary care physician for things to follow:  Follow up with Dr.End  As scheduled   Admission Diagnosis  Hypertensive urgency [I16.0] Chest pain, unspecified type [R07.9]   Discharge Diagnosis  Hypertensive urgency [I16.0] Chest pain, unspecified type [R07.9]    Active Problems:   Atypical chest pain   Uncontrolled hypertension      Past Medical History:  Diagnosis Date  . Acute appendicitis   . Appendicitis 08/18/2016  . GERD (gastroesophageal reflux disease)    NO MEDS  . Hepatitis C   . Hypertension     Past Surgical History:  Procedure Laterality Date  . HERNIA REPAIR  11/18/2016  . INGUINAL HERNIA REPAIR Bilateral 11/17/2016   Procedure: LAPAROSCOPIC BILATERAL INGUINAL HERNIA REPAIR;  Surgeon: Leafy Ro, MD;  Location: [facility] ORS;  Service: General;  Laterality: Bilateral;  . INSERTION OF MESH Bilateral 11/17/2016   Procedure: INSERTION OF MESH;  Surgeon: Leafy Ro, MD;  Location: [facility] ORS;  Service: General;  Laterality: Bilateral;  . LAPAROSCOPIC APPENDECTOMY N/A 08/18/2016   Procedure: APPENDECTOMY LAPAROSCOPIC;  Surgeon: Leafy Ro, MD;  Location: [facility] ORS;  Service: General;  Laterality: N/A;       History of present illness and  Hospital Course:     Kindly see H&P for history of present illness and admission details, please review complete Labs, Consult reports and Test reports for all details in brief  HPI  from the history and physical done on the day of admission 57 year old male male patient admitted for chest pain, GERD, mild hypotension with BP 193/90.  When he  came admitted for chest pain rule out.   Hospital Course  #1 hypertensive urgency: Patient received a amlodipine 5 mg daily, aspirin, troponins were done 3 times patient troponins have been -3 times, EKG unremarkable, seen by cardiology, had Lexiscan stress test, showed low risk plan, Dr. Okey Dupre recommended discharging the patient on Norvasc 5 mg daily, Carafate, Protonix, he will see the patient in the clinic. 2.  GERD, received Protonix, Carafate.    Discharge Condition: stable   Follow UP  Follow-up Information    End, Cristal Deer, MD. Schedule an appointment as soon as possible for a visit in 2 weeks.   Specialty:  Cardiology Why:  Patient can see Dr. Okey Dupre on October 25th at 3:40 Contact information: [address] [place] [address] [phone]        [facility]. Go on 01/24/2018.   Specialty:  General Practice Why:  @ 6:20 pm Contact information: [address] [place] [address] [phone]             Discharge Instructions  and  Discharge Medications      Allergies as of 12/20/2017   No Known Allergies     Medication List    TAKE these medications   amLODipine 5 MG tablet Commonly known as:  NORVASC Take 1 tablet (5 mg total) by mouth daily.   aspirin 81 MG EC tablet Take 1 tablet (81 mg total) by mouth daily.   multivitamin tablet Take 1 tablet by mouth daily.   pantoprazole 40 MG tablet Commonly known as:  PROTONIX Take 1 tablet (40 mg total) by mouth  daily.         Diet and Activity recommendation: See Discharge Instructions above   Consults obtained -cardiology from [facility]   Major procedures and Radiology Reports - PLEASE review detailed and final reports for all details, in brief -      Dg Chest 2 View  Result Date: 12/19/2017 CLINICAL DATA:  Elevated blood pressure chest tightness EXAM: CHEST - 2 VIEW COMPARISON:  08/18/2016 FINDINGS: No focal opacity or  pleural effusion. Stable cardiomediastinal silhouette with aortic atherosclerosis. No pneumothorax. Hyperinflated lungs. IMPRESSION: No active cardiopulmonary disease. Electronically Signed   By: Jasmine Pang M.D.   On: 12/19/2017 19:43   Nm Myocar Multi W/spect W/wall Motion / Ef  Result Date: 12/20/2017  Abnormal, probably low risk myocardial perfusion stress test  There is a small in size, severe, fixed basal and mid inferoseptal defect consistent with scar but cannot rule out artifact.  There is no significant ischemia.  The left ventricular ejection fraction is normal(>65%) with normal regional wall motion.  The sensitivity and specificity of the study are degraded by extracardiac activity and diaphragmatic attenuation.     Micro Results    No results found for this or any previous visit (from the past 240 hour(s)).     Today   Subjective:   Shane Dominguez today has no headache,no chest abdominal pain,no new weakness tingling or numbness, feels much better wants to go home today.   Objective:   Blood pressure 137/83, pulse (!) 59, temperature 98.4 F (36.9 C), temperature source Oral, resp. rate 18, height 6\' 1"  (1.854 m), weight 60.9 kg, SpO2 100 %.  No intake or output data in the 24 hours ending 12/25/17 1428  Exam Awake Alert, Oriented x 3, No new F.N deficits, Normal affect [place].AT,PERRAL Supple Neck,No JVD, No cervical lymphadenopathy appriciated.  Symmetrical Chest wall movement, Good air movement bilaterally, CTAB RRR,No Gallops,Rubs or new Murmurs, No Parasternal Heave +ve B.Sounds, Abd Soft, Non tender, No organomegaly appriciated, No rebound -guarding or rigidity. No Cyanosis, Clubbing or edema, No new Rash or bruise  Data Review   CBC w Diff:  Lab Results  Component Value Date   WBC 4.5 12/20/2017   HGB 15.0 12/20/2017   HGB 16.8 07/18/2013   HCT 43.7 12/20/2017   HCT 48.8 07/18/2013   PLT 200 12/20/2017   PLT 240 07/18/2013    CMP:  Lab  Results  Component Value Date   NA 138 12/20/2017   NA 138 07/18/2013   K 3.5 12/20/2017   K 3.5 07/18/2013   CL 103 12/20/2017   CL 102 07/18/2013   CO2 26 12/20/2017   CO2 33 (H) 07/18/2013   BUN 10 12/20/2017   BUN 10 07/18/2013   CREATININE 0.66 12/20/2017   CREATININE 0.84 07/18/2013   GLU 89 05/01/2013   PROT 8.4 (H) 08/18/2016   PROT 8.9 (H) 07/18/2013   ALBUMIN 4.1 08/18/2016   ALBUMIN 3.8 07/18/2013   BILITOT 1.1 08/18/2016   BILITOT 0.6 07/18/2013   ALKPHOS 53 08/18/2016   ALKPHOS 64 07/18/2013   AST 28 08/18/2016   AST 19 07/18/2013   ALT 22 08/18/2016   ALT 24 07/18/2013  .   Total Time in preparing paper work, data evaluation and todays exam - 35 minutes  Katha Hamming M.D on 12/20/2017 at 2:28 PM    Note: This dictation was prepared with Dragon dictation along with smaller phrase technology. Any transcriptional errors that result from this process are unintentional.

## 2017-12-29 ENCOUNTER — Encounter: Payer: Self-pay | Admitting: Internal Medicine

## 2017-12-29 ENCOUNTER — Ambulatory Visit (INDEPENDENT_AMBULATORY_CARE_PROVIDER_SITE_OTHER): Payer: Medicaid Other | Admitting: Internal Medicine

## 2017-12-29 VITALS — BP 144/80 | HR 75

## 2017-12-29 DIAGNOSIS — R0789 Other chest pain: Secondary | ICD-10-CM | POA: Diagnosis not present

## 2017-12-29 DIAGNOSIS — R6889 Other general symptoms and signs: Secondary | ICD-10-CM

## 2017-12-29 DIAGNOSIS — I10 Essential (primary) hypertension: Secondary | ICD-10-CM

## 2017-12-29 NOTE — Progress Notes (Signed)
Follow-up Outpatient Visit Date: 12/29/2017  Primary Care Provider: Patient, No Pcp Per No address on file  Chief Complaint: Chest congestion  HPI:  Shane Dominguez is a 57 y.o. year-old male with history of hypertension, GERD, and hepatitis C, who presents for follow-up of chest pain.  He was admitted to [facility] earlier this month with chest pain radiating to his neck while bending over to pick up dog food.  Pain resolved spontaneously.  Work-up in the hospital was notable for negative troponin x3 and echocardiogram with normal LVEF and wall motion.  Myocardial perfusion stress test showed a small basal and mid inferoseptal defect suggestive of scar but potentially representing artifact.  LVEF was hyperdynamic with normal wall motion.  Study was felt to be low risk.  Cardiac catheterization versus medical therapy were discussed with Shane Dominguez; he wished to proceed with medical therapy and close outpatient follow-up.  He was discharged on amlodipine, aspirin, and pantoprazole.  Today, Shane Dominguez is concerned about some sinus drainage and congestion in his throat.  He asks about medication to help loosen his secretions.  He denies further chest pain as well as shortness of breath, palpitations, lightheadedness, edema, and fevers.  He has been compliant with aspirin, amlodipine, and pantoprazole, which he is tolerating well.  He noted mild orthostatic lightheadedness when he first started taking amlodipine, though this has since resolved.  --------------------------------------------------------------------------------------------------  Cardiovascular History & Procedures: Cardiovascular Problems:  Atypical chest pain  Risk Factors:  Hypertension, male gender, tobacco use, and age greater than 23  Cath/PCI:  None  CV Surgery:  None  EP Procedures and Devices:  None  Non-Invasive Evaluation(s):  Pharmacologic myocardial perfusion stress test (12/20/2017): Abnormal, probably low  risk myocardial perfusion stress test.  There is a small in size, severe, fixed basal and mid inferoseptal defect consistent with scar but cannot rule out artifact.  No significant ischemia is identified.  LVEF greater than 65% with normal wall motion.  Significant extracardiac activity and diaphragmatic attenuation noted.  Transthoracic echocardiogram (12/20/2017): Normal LV size and wall thickness.  LVEF 60-65% with normal wall motion and diastolic function.  Mild mitral regurgitation.  Normal RV size and function.  Recent CV Pertinent Labs: Lab Results  Component Value Date   CHOL 131 12/20/2017   HDL 35 (L) 12/20/2017   LDLCALC 85 12/20/2017   TRIG 57 12/20/2017   CHOLHDL 3.7 12/20/2017   INR 1.16 08/18/2016   K 3.5 12/20/2017   K 3.5 07/18/2013   BUN 10 12/20/2017   BUN 10 07/18/2013   CREATININE 0.66 12/20/2017   CREATININE 0.84 07/18/2013     Recent CV Pertinent Labs: Lab Results  Component Value Date   CHOL 131 12/20/2017   HDL 35 (L) 12/20/2017   LDLCALC 85 12/20/2017   TRIG 57 12/20/2017   CHOLHDL 3.7 12/20/2017   INR 1.16 08/18/2016   K 3.5 12/20/2017   K 3.5 07/18/2013   BUN 10 12/20/2017   BUN 10 07/18/2013   CREATININE 0.66 12/20/2017   CREATININE 0.84 07/18/2013    Past medical and surgical history were reviewed and updated in EPIC.  Current Meds  Medication Sig  . amLODipine (NORVASC) 5 MG tablet Take 1 tablet (5 mg total) by mouth daily.  Marland Kitchen aspirin EC 81 MG EC tablet Take 1 tablet (81 mg total) by mouth daily.  . Multiple Vitamin (MULTIVITAMIN) tablet Take 1 tablet by mouth daily.  . pantoprazole (PROTONIX) 40 MG tablet Take 1 tablet (40 mg total) by  mouth daily.    Allergies: Patient has no known allergies.  Social History   Tobacco Use  . Smoking status: Current Every Day Smoker  . Smokeless tobacco: Never Used  Substance Use Topics  . Alcohol use: No  . Drug use: No    Family History  Problem Relation Age of Onset  . Stroke Mother     . COPD Father   . Emphysema Father   . Stroke Brother     Review of Systems: A 12-system review of systems was performed and was negative except as noted in the HPI.  --------------------------------------------------------------------------------------------------  Physical Exam: BP (!) 144/80 (BP Location: Left Arm, Patient Position: Sitting, Cuff Size: Normal)   Pulse 75   General: NAD HEENT: No conjunctival pallor or scleral icterus. Moist mucous membranes.  OP clear. Neck: Supple without lymphadenopathy, thyromegaly, JVD, or HJR. Lungs: Normal work of breathing. Clear to auscultation bilaterally without wheezes or crackles. Heart: Regular rate and rhythm without murmurs, rubs, or gallops. Non-displaced PMI. Abd: Bowel sounds present. Soft, NT/ND without hepatosplenomegaly Ext: No lower extremity edema. Radial, PT, and DP pulses are 2+ bilaterally. Skin: Warm and dry without rash.  EKG: Normal sinus rhythm with possible left atrial enlargement and early repolarization.  No significant change from prior tracings.  Lab Results  Component Value Date   WBC 4.5 12/20/2017   HGB 15.0 12/20/2017   HCT 43.7 12/20/2017   MCV 95.6 12/20/2017   PLT 200 12/20/2017    Lab Results  Component Value Date   NA 138 12/20/2017   K 3.5 12/20/2017   CL 103 12/20/2017   CO2 26 12/20/2017   BUN 10 12/20/2017   CREATININE 0.66 12/20/2017   GLUCOSE 93 12/20/2017   ALT 22 08/18/2016    Lab Results  Component Value Date   CHOL 131 12/20/2017   HDL 35 (L) 12/20/2017   LDLCALC 85 12/20/2017   TRIG 57 12/20/2017   CHOLHDL 3.7 12/20/2017    --------------------------------------------------------------------------------------------------  ASSESSMENT AND PLAN: Atypical chest pain This has resolved with addition of amlodipine and pantoprazole.  Given lack of further symptoms and low risk stress test, we will defer catheterization and continue with current  therapy.  Hypertension Blood pressure is mildly elevated today but better compared with recent hospitalization and prior outpatient visits.  We will continue with amlodipine 5 mg daily.  I have encouraged Shane Dominguez to minimize his sodium intake.  Congestion Most likely due to URI and/or allergies.  I have recommended that Shane Dominguez try Coricidin HBP and guaifenesin.  If symptoms persist, empiric trial of a nasal fluticasone could be considered.  I will defer further management to his PCP, whom he will be seeing next month.  Follow-up: Return to clinic in 3 months.  [name], MD 12/29/2017 8:29 PM

## 2017-12-29 NOTE — Patient Instructions (Addendum)
Medication Instructions:  Your physician recommends that you continue on your current medications as directed. Please refer to the Current Medication list given to you today.  Dr End recommends you try over-the-counter Coricidin HBP and Guafensin to help with your cold symptoms. If that does not work, you could also try Flonase.  If you need a refill on your cardiac medications before your next appointment, please call your pharmacy.   Lab work: NONE If you have labs (blood work) drawn today and your tests are completely normal, you will receive your results only by: Marland Kitchen MyChart Message (if you have MyChart) OR . A paper copy in the mail If you have any lab test that is abnormal or we need to change your treatment, we will call you to review the results.  Testing/Procedures: NONE  Follow-Up: At [facility], you and your health needs are our priority.  As part of our continuing mission to provide you with exceptional heart care, we have created designated Provider Care Teams.  These Care Teams include your primary Cardiologist (physician) and Advanced Practice Providers (APPs -  Physician Assistants and Nurse Practitioners) who all work together to provide you with the care you need, when you need it. You will need a follow up appointment in 3 months.  Please call our office 2 months in advance to schedule this appointment.  You may see DR Cristal Deer END or one of the following Advanced Practice Providers on your designated Care Team:   Nicolasa Ducking, NP Eula Listen, PA-C . Marisue Ivan, PA-C

## 2018-01-16 ENCOUNTER — Ambulatory Visit: Payer: Medicaid Other | Admitting: Physician Assistant

## 2018-03-19 ENCOUNTER — Encounter: Payer: Self-pay | Admitting: Internal Medicine

## 2018-03-19 ENCOUNTER — Ambulatory Visit (INDEPENDENT_AMBULATORY_CARE_PROVIDER_SITE_OTHER): Payer: Medicaid Other | Admitting: Internal Medicine

## 2018-03-19 VITALS — BP 172/100 | HR 71 | Ht 69.0 in | Wt 146.0 lb

## 2018-03-19 DIAGNOSIS — I10 Essential (primary) hypertension: Secondary | ICD-10-CM | POA: Diagnosis not present

## 2018-03-19 DIAGNOSIS — R0789 Other chest pain: Secondary | ICD-10-CM | POA: Diagnosis not present

## 2018-03-19 NOTE — Patient Instructions (Signed)
Medication Instructions:  Your physician has recommended you make the following change in your medication:  1- TAKE Amlodipine 5 mg by mouth once a day. 2- TAKE Aspirin 81 mg by mouth once a day.  If you need a refill on your cardiac medications before your next appointment, please call your pharmacy.   Lab work: none If you have labs (blood work) drawn today and your tests are completely normal, you will receive your results only by: Marland Kitchen. MyChart Message (if you have MyChart) OR . A paper copy in the mail If you have any lab test that is abnormal or we need to change your treatment, we will call you to review the results.  Testing/Procedures: none  Follow-Up: At [facility], you and your health needs are our priority.  As part of our continuing mission to provide you with exceptional heart care, we have created designated Provider Care Teams.  These Care Teams include your primary Cardiologist (physician) and Advanced Practice Providers (APPs -  Physician Assistants and Nurse Practitioners) who all work together to provide you with the care you need, when you need it. You will need a follow up appointment in 6 months.  Please call our office 2 months in advance to schedule this appointment.  You may see DR Cristal DeerHRISTOPHER END or one of the following Advanced Practice Providers on your designated Care Team:   Nicolasa Duckinghristopher Berge, NP Eula Listenyan Dunn, PA-C . Marisue IvanJacquelyn Visser, PA-C   Your physician recommends that you schedule a follow-up appointment in: 1 MONTH WITH NURSE FOR BLOOD PRESSURE CHECK. PLEASE TAKE YOU BLOOD PRESSURE MEDICATIONS THE DAY OF APPOINTMENT ABOUT 2 HOURS PRIOR TO.      DASH Eating Plan DASH stands for "Dietary Approaches to Stop Hypertension." The DASH eating plan is a healthy eating plan that has been shown to reduce high blood pressure (hypertension). It may also reduce your risk for type 2 diabetes, heart disease, and stroke. The DASH eating plan may also help with weight  loss. What are tips for following this plan?  General guidelines  Avoid eating more than 2,300 mg (milligrams) of salt (sodium) a day. If you have hypertension, you may need to reduce your sodium intake to 1,500 mg a day.  Limit alcohol intake to no more than 1 drink a day for nonpregnant women and 2 drinks a day for men. One drink equals 12 oz of beer, 5 oz of wine, or 1 oz of hard liquor.  Work with your health care provider to maintain a healthy body weight or to lose weight. Ask what an ideal weight is for you.  Get at least 30 minutes of exercise that causes your heart to beat faster (aerobic exercise) most days of the week. Activities may include walking, swimming, or biking.  Work with your health care provider or diet and nutrition specialist (dietitian) to adjust your eating plan to your individual calorie needs. Reading food labels   Check food labels for the amount of sodium per serving. Choose foods with less than 5 percent of the Daily Value of sodium. Generally, foods with less than 300 mg of sodium per serving fit into this eating plan.  To find whole grains, look for the word "whole" as the first word in the ingredient list. Shopping  Buy products labeled as "low-sodium" or "no salt added."  Buy fresh foods. Avoid canned foods and premade or frozen meals. Cooking  Avoid adding salt when cooking. Use salt-free seasonings or herbs instead of table salt  or sea salt. Check with your health care provider or pharmacist before using salt substitutes.  Do not fry foods. Cook foods using healthy methods such as baking, boiling, grilling, and broiling instead.  Cook with heart-healthy oils, such as olive, canola, soybean, or sunflower oil. Meal planning  Eat a balanced diet that includes: ? 5 or more servings of fruits and vegetables each day. At each meal, try to fill half of your plate with fruits and vegetables. ? Up to 6-8 servings of whole grains each day. ? Less than  6 oz of lean meat, poultry, or fish each day. A 3-oz serving of meat is about the same size as a deck of cards. One egg equals 1 oz. ? 2 servings of low-fat dairy each day. ? A serving of nuts, seeds, or beans 5 times each week. ? Heart-healthy fats. Healthy fats called Omega-3 fatty acids are found in foods such as flaxseeds and coldwater fish, like sardines, salmon, and mackerel.  Limit how much you eat of the following: ? Canned or prepackaged foods. ? Food that is high in trans fat, such as fried foods. ? Food that is high in saturated fat, such as fatty meat. ? Sweets, desserts, sugary drinks, and other foods with added sugar. ? Full-fat dairy products.  Do not salt foods before eating.  Try to eat at least 2 vegetarian meals each week.  Eat more home-cooked food and less restaurant, buffet, and fast food.  When eating at a restaurant, ask that your food be prepared with less salt or no salt, if possible. What foods are recommended? The items listed may not be a complete list. Talk with your dietitian about what dietary choices are best for you. Grains Whole-grain or whole-wheat bread. Whole-grain or whole-wheat pasta. Brown rice. Orpah Cobb. Bulgur. Whole-grain and low-sodium cereals. Pita bread. Low-fat, low-sodium crackers. Whole-wheat flour tortillas. Vegetables Fresh or frozen vegetables (raw, steamed, roasted, or grilled). Low-sodium or reduced-sodium tomato and vegetable juice. Low-sodium or reduced-sodium tomato sauce and tomato paste. Low-sodium or reduced-sodium canned vegetables. Fruits All fresh, dried, or frozen fruit. Canned fruit in natural juice (without added sugar). Meat and other protein foods Skinless chicken or [place]. Ground chicken or [place]. Pork with fat trimmed off. Fish and seafood. Egg whites. Dried beans, peas, or lentils. Unsalted nuts, nut butters, and seeds. Unsalted canned beans. Lean cuts of beef with fat trimmed off. Low-sodium, lean deli  meat. Dairy Low-fat (1%) or fat-free (skim) milk. Fat-free, low-fat, or reduced-fat cheeses. Nonfat, low-sodium ricotta or cottage cheese. Low-fat or nonfat yogurt. Low-fat, low-sodium cheese. Fats and oils Soft margarine without trans fats. Vegetable oil. Low-fat, reduced-fat, or light mayonnaise and salad dressings (reduced-sodium). Canola, safflower, olive, soybean, and sunflower oils. Avocado. Seasoning and other foods Herbs. Spices. Seasoning mixes without salt. Unsalted popcorn and pretzels. Fat-free sweets. What foods are not recommended? The items listed may not be a complete list. Talk with your dietitian about what dietary choices are best for you. Grains Baked goods made with fat, such as croissants, muffins, or some breads. Dry pasta or rice meal packs. Vegetables Creamed or fried vegetables. Vegetables in a cheese sauce. Regular canned vegetables (not low-sodium or reduced-sodium). Regular canned tomato sauce and paste (not low-sodium or reduced-sodium). Regular tomato and vegetable juice (not low-sodium or reduced-sodium). Rosita Fire. Olives. Fruits Canned fruit in a light or heavy syrup. Fried fruit. Fruit in cream or butter sauce. Meat and other protein foods Fatty cuts of meat. Ribs. Fried meat. Tomasa Blase. Sausage. [employer]  and other processed lunch meats. Salami. Fatback. Hotdogs. Bratwurst. Salted nuts and seeds. Canned beans with added salt. Canned or smoked fish. Whole eggs or egg yolks. Chicken or [place] with skin. Dairy Whole or 2% milk, cream, and half-and-half. Whole or full-fat cream cheese. Whole-fat or sweetened yogurt. Full-fat cheese. Nondairy creamers. Whipped toppings. Processed cheese and cheese spreads. Fats and oils Butter. Stick margarine. Lard. Shortening. Ghee. Bacon fat. Tropical oils, such as coconut, palm kernel, or palm oil. Seasoning and other foods Salted popcorn and pretzels. Onion salt, garlic salt, seasoned salt, table salt, and sea salt. Worcestershire  sauce. Tartar sauce. Barbecue sauce. Teriyaki sauce. Soy sauce, including reduced-sodium. Steak sauce. Canned and packaged gravies. Fish sauce. Oyster sauce. Cocktail sauce. Horseradish that you find on the shelf. Ketchup. Mustard. Meat flavorings and tenderizers. Bouillon cubes. Hot sauce and Tabasco sauce. Premade or packaged marinades. Premade or packaged taco seasonings. Relishes. Regular salad dressings. Where to find more information:  National Heart, Lung, and Blood Institute: PopSteam.is  American Heart Association: www.heart.org Summary  The DASH eating plan is a healthy eating plan that has been shown to reduce high blood pressure (hypertension). It may also reduce your risk for type 2 diabetes, heart disease, and stroke.  With the DASH eating plan, you should limit salt (sodium) intake to 2,300 mg a day. If you have hypertension, you may need to reduce your sodium intake to 1,500 mg a day.  When on the DASH eating plan, aim to eat more fresh fruits and vegetables, whole grains, lean proteins, low-fat dairy, and heart-healthy fats.  Work with your health care provider or diet and nutrition specialist (dietitian) to adjust your eating plan to your individual calorie needs. This information is not intended to replace advice given to you by your health care provider. Make sure you discuss any questions you have with your health care provider. Document Released: 02/10/2011 Document Revised: 02/15/2016 Document Reviewed: 02/15/2016 Elsevier Interactive Patient Education  2019 [employer].

## 2018-03-19 NOTE — Progress Notes (Signed)
Follow-up Outpatient Visit Date: 03/19/2018  Primary Care Provider: Patient, No Pcp Per No address on file  Chief Complaint: Follow-up chest pain and elevated blood pressure  HPI:  Shane Dominguez is a 58 y.o. year-old male with history of hypertension, GERD, and hepatitis C, who presents for follow-up of chest pain.  I last saw him in late October after preceding hospitalization for chest pain.  Troponins were negative at that time.  Myocardial perfusion stress test showed a small basal and mid inferoseptal defect concerning for artifact versus scar.  Catheterization was deferred.  At the time of follow-up in October, he was feeling well without further chest pain.  His only concern was of sinus drainage and congestion.  Coricidin HBP was recommended for the symptoms.  No further testing or medication change was recommended.  Today, Shane Dominguez reports that he has been feeling well other than occasional heartburn for which he uses as needed Tums.  He has not had any significant chest pain reminiscent of what landed him in the hospital in October.  He reports that he has stopped taking all of his standing medications including aspirin and amlodipine.  He simply states that he is "not a medicine man."  He was not having any side effects from the medications.  He has been checking his blood pressure at home and notes that it is typically in the 130's/80's.  He has been trying to watch his diet and is eating lots of celery to combat high blood pressure.  He denies shortness of breath, orthopnea, PND, edema, palpitations, and lightheadedness.  --------------------------------------------------------------------------------------------------  Cardiovascular History & Procedures: Cardiovascular Problems:  Atypical chest pain  Risk Factors:  Hypertension, male gender, tobacco use, and age greater than 70  Cath/PCI:  None  CV Surgery:  None  EP Procedures and  Devices:  None  Non-Invasive Evaluation(s):  Pharmacologic myocardial perfusion stress test (12/20/2017): Abnormal, probably low risk myocardial perfusion stress test.  There is a small in size, severe, fixed basal and mid inferoseptal defect consistent with scar but cannot rule out artifact.  No significant ischemia is identified.  LVEF greater than 65% with normal wall motion.  Significant extracardiac activity and diaphragmatic attenuation noted.  Transthoracic echocardiogram (12/20/2017): Normal LV size and wall thickness.  LVEF 60-65% with normal wall motion and diastolic function.  Mild mitral regurgitation.  Normal RV size and function.  Recent CV Pertinent Labs: Lab Results  Component Value Date   CHOL 131 12/20/2017   HDL 35 (L) 12/20/2017   LDLCALC 85 12/20/2017   TRIG 57 12/20/2017   CHOLHDL 3.7 12/20/2017   INR 1.16 08/18/2016   K 3.5 12/20/2017   K 3.5 07/18/2013   BUN 10 12/20/2017   BUN 10 07/18/2013   CREATININE 0.66 12/20/2017   CREATININE 0.84 07/18/2013    Past medical and surgical history were reviewed and updated in EPIC.  Medications: Not taking any medications at this time.  Allergies: Patient has no known allergies.  Social History   Tobacco Use  . Smoking status: Former Smoker    Packs/day: 2.00    Years: 40.00    Pack years: 80.00    Types: Cigarettes    Last attempt to quit: 02/16/2018    Years since quitting: 0.0  . Smokeless tobacco: Never Used  Substance Use Topics  . Alcohol use: Not Currently  . Drug use: No    Family History  Problem Relation Age of Onset  . Stroke Mother   . COPD Father   .  Emphysema Father   . Stroke Brother     Review of Systems: A 12-system review of systems was performed and was negative except as noted in the HPI.  --------------------------------------------------------------------------------------------------  Physical Exam: BP (!) 172/100 (BP Location: Left Arm, Patient Position: Sitting, Cuff  Size: Normal)   Pulse 71   Ht 5\' 9"  (1.753 m)   Wt 146 lb (66.2 kg)   BMI 21.56 kg/m   Repeat BP: 170/96  General: NAD. HEENT: No conjunctival pallor or scleral icterus. Moist mucous membranes.  OP clear. Neck: Supple without lymphadenopathy, thyromegaly, JVD, or HJR. Lungs: Normal work of breathing. Clear to auscultation bilaterally without wheezes or crackles. Heart: Regular rate and rhythm without murmurs, rubs, or gallops. Non-displaced PMI. Abd: Bowel sounds present. Soft, NT/ND without hepatosplenomegaly Ext: No lower extremity edema.  2+ radial pulses bilaterally. Skin: Warm and dry without rash.  EKG: Normal sinus rhythm with possible left atrial enlargement and early repolarization.  No significant change from prior tracing on 12/29/2017.  Lab Results  Component Value Date   WBC 4.5 12/20/2017   HGB 15.0 12/20/2017   HCT 43.7 12/20/2017   MCV 95.6 12/20/2017   PLT 200 12/20/2017    Lab Results  Component Value Date   NA 138 12/20/2017   K 3.5 12/20/2017   CL 103 12/20/2017   CO2 26 12/20/2017   BUN 10 12/20/2017   CREATININE 0.66 12/20/2017   GLUCOSE 93 12/20/2017   ALT 22 08/18/2016    Lab Results  Component Value Date   CHOL 131 12/20/2017   HDL 35 (L) 12/20/2017   LDLCALC 85 12/20/2017   TRIG 57 12/20/2017   CHOLHDL 3.7 12/20/2017    --------------------------------------------------------------------------------------------------  ASSESSMENT AND PLAN: Atypical chest pain No recurrence since last visit.  Pain was most likely due to GERD.  However, myocardial perfusion stress test was abnormal albeit low risk with prior scar versus artifact.  Continue as needed Tums.  If pain becomes more persistent, Shane Dominguez should restart pantoprazole and contact us to discuss need for further cardiac evaluation.  I have also recommended restarting aspirin 81 mg daily.  Hypertension Blood pressure poorly controlled due to medication noncompliance.  I stressed  the importance of taking his medications regularly and the long-term effects of uncontrolled hypertension.  Shane Dominguez has agreed to restart amlodipine 5 mg daily.  I have also provided him with information about the DASH diet.  I will have him come back in 1 month for blood pressure check.  Follow-up: Blood pressure check in 1 month.  Return to see me in 6 months.  [name], MD 03/19/2018 9:34 AM

## 2018-04-19 ENCOUNTER — Ambulatory Visit: Payer: Medicaid Other

## 2018-05-14 ENCOUNTER — Ambulatory Visit (INDEPENDENT_AMBULATORY_CARE_PROVIDER_SITE_OTHER): Payer: Medicaid Other

## 2018-05-14 VITALS — BP 172/93 | HR 98 | Ht 69.0 in | Wt 148.1 lb

## 2018-05-14 DIAGNOSIS — I10 Essential (primary) hypertension: Secondary | ICD-10-CM

## 2018-05-14 NOTE — Progress Notes (Signed)
1.) Reason for visit: Nurse visit BP check after starting Amlodipine 5mg  daily  2.) Name of MD requesting visit: [name], MD  3.) H&P: patient states that he is doing well except he has been battling a cold for the last week. He has picked up the prescription for Amlodipine 5mg  daily, but he has not taken the medication. He sts that he prefers to go the natural route. He has not checked his BP since he was last seen in our office on 03/19/18. He denies any reoccurrence of chest pain. Counseled him on the importance of managing his blood pressure and the negative effects uncontrolled hypertension could have on his heart. Patient states that he is willing to start the medication as recommended. He is unable to monitor his BP daily at home, but sts that he will commit to going to his local pharmacy a couple of times a week to monitor his BP. I asked him to contact the office in 2 weeks to share the readings with Dr.End. advised the patient to contact the office sooner if any questions or concerns. He is recalled to follow up with Dr.End in July 2020. He understands that he will need to call the office in April to schedule. Advised the patient we will contact him if Dr.End has additional recommendations in the interim.   4.) ROS related to problem: patient denies chest pain, sob, swelling, palpitations, headache, n/v. He does report a productive cough (he is currently taking plain Mucinex), denies fever, aches, or chills.  5.) Assessment and plan per MD: patient has not been compliant with Dr.End's recommendation to start Amlodipine 5mg  daily. His BP today was elevated. Patient verbally commits to starting Amlodipine 5mg  daily when he returns home today and plans on continuing the medication daily as prescribed.

## 2018-05-15 ENCOUNTER — Telehealth: Payer: Self-pay | Admitting: Internal Medicine

## 2018-05-15 NOTE — Telephone Encounter (Signed)
Patient girlfriend Lynden Ang calling  States that the whole family has been sick recently, possibly the flu States that patient has been taking mucinex sinus max but has not been helping States that this is been going on for about 3 weeks now and patient has been unable to sleep, not getting any rest Would like to know if there is anything we can prescribe to help Please call Lynden Ang to discuss, she can get in touch with Edrik if need be

## 2018-05-15 NOTE — Telephone Encounter (Signed)
Patient gf cathy wants our office to fax a note to  pcp office stating patient has productive cough and otc drugs will not help  So they will rx an antibiotic.

## 2018-05-15 NOTE — Telephone Encounter (Signed)
DPR on file. Spoke with the pt girlfriend [place]. Adv her that we do not prescribe non-cardiac medications. The pt will need to contact his pcp or go to a urgent care for cold like symptoms. Lynden Ang sts that the pt does not like to go to the doctors, and asked for recommendation of OTC medications. Adv her that he should avoid OTC medications with a "D" like pseudoephedrine. Adv her that these type of ingredients could raise the pt BP. Recommended that they discuss OTC options with the Pharmacist @ their local pharmacy.

## 2018-05-15 NOTE — Telephone Encounter (Signed)
Contacted the pt girlfriend [place]. Adv her that we cannot honor that request. The pt needs treatment for a non-cardiac issue, that should be facilitated through his pcp. Cathy voiced understanding.

## 2018-05-18 ENCOUNTER — Emergency Department (HOSPITAL_COMMUNITY)
Admission: EM | Admit: 2018-05-18 | Discharge: 2018-05-18 | Discharge status: Absent without leave | Payer: Medicaid Other

## 2018-06-13 ENCOUNTER — Telehealth: Payer: Self-pay | Admitting: *Deleted

## 2018-06-13 NOTE — Telephone Encounter (Signed)
Please confirm that the patient has a reliable BP cuff, as previously scheduled RN visit was intended to reassess BP after medication changes.  If he cannot check his BP at home, a virtual visit will be of little utility and in-office RN or provider visit will need to be scheduled.  Thanks.  [name], MD [facility] Pager: [phone]

## 2018-06-13 NOTE — Telephone Encounter (Signed)
Called patient back. He has not used his BP cuff in a while. He went on to say that he really does not want to come to the hospital or out anywhere to expose himself to the virus. I offered office visit for this Friday and next week on available days and he refused. He is aware of the following as well. Visit will be cancelled at this time.     Cardiac Questionnaire:    Since your last visit or hospitalization:    1. Have you been having new or worsening chest pain? NO   2. Have you been having new or worsening shortness of breath? NO 3. Have you been having new or worsening leg swelling, wt gain, or increase in abdominal girth (pants fitting more tightly)? NO   4. Have you had any passing out spells? NO    *A YES to any of these questions would result in the appointment being kept. *If all the answers to these questions are NO, we should indicate that given the current situation regarding the worldwide coronarvirus pandemic, at the recommendation of the CDC, we are looking to limit gatherings in our waiting area, and thus will reschedule their appointment beyond four weeks from today.   _____________  .     Primary Cardiologist:  DR Cristal Deer END   Patient contacted.  History reviewed.  No symptoms to suggest any unstable cardiac conditions.  Based on discussion, with current pandemic situation, we will be postponing this appointment for [facility] with a plan for f/u in 12 wks or sooner if feasible/necessary.  If symptoms change, he has been instructed to contact our office.   Routing to C19 CANCEL pool for tracking (P CV DIV CV19 CANCEL - reason for visit "other.") and assigning priority (1 = 4-6 wks, 2 = 6-12 wks, 3 = >12 wks).   Catalina Gravel, RN  06/13/2018 1:21 PM         .

## 2018-06-13 NOTE — Telephone Encounter (Signed)
Patient was scheduled for nurse visit tomorrow. Not doing nurse visits at this time due to COVID19. Called patient. He is agreeable to Virtual telephone visit with Dr End tomorrow instead. He does not have a smartphone. He gave verbal consent. He does have a BP cuff. He has not been taking his blood pressure. I asked him to take it about 2 hours after BP medications. He stated he has not been taking medication because he takes natural supplements. Advised him to take BP/HR tonight and in the morning to share with Dr End.       Virtual Visit Pre-Appointment Phone Call  Steps For Call:  1. Confirm consent - "In the setting of the current Covid19 crisis, you are scheduled for a (phone or video) visit with your provider on (date) at (time).  Just as we do with many in-office visits, in order for you to participate in this visit, we must obtain consent.  If you'd like, I can send this to your mychart (if signed up) or email for you to review.  Otherwise, I can obtain your verbal consent now.  All virtual visits are billed to your insurance company just like a normal visit would be.  By agreeing to a virtual visit, we'd like you to understand that the technology does not allow for your provider to perform an examination, and thus may limit your provider's ability to fully assess your condition.  Finally, though the technology is pretty good, we cannot assure that it will always work on either your or our end, and in the setting of a video visit, we may have to convert it to a phone-only visit.  In either situation, we cannot ensure that we have a secure connection.  Are you willing to proceed?"  2. Give patient instructions for WebEx download to smartphone as Dominguez if video visit  3. Advise patient to be prepared with any vital sign or heart rhythm information, their current medicines, and a piece of paper and pen handy for any instructions they may receive the day of their visit  4. Inform patient  they will receive a phone call 15 minutes prior to their appointment time (may be from unknown caller ID) so they should be prepared to answer  5. Confirm that appointment type is correct in Epic appointment notes (video vs telephone)    TELEPHONE CALL NOTE  Shane Dominguez has been deemed a candidate for a follow-up tele-health visit to limit community exposure during the Covid-19 pandemic. I spoke with the patient via phone to ensure availability of phone/video source, confirm preferred email & phone number, and discuss instructions and expectations.  I reminded Shane Dominguez to be prepared with any vital sign and/or heart rhythm information that could potentially be obtained via home monitoring, at the time of his visit. I reminded Shane Dominguez to expect a phone call at the time of his visit if his visit.  Did the patient verbally acknowledge consent to treatment? yes  Catalina GravelClark, Jennifer Owens, RN 06/13/2018 11:55 AM   DOWNLOADING THE WEBEX SOFTWARE TO SMARTPHONE  - If Apple, go to [employer] Store and type in WebEx in the search bar. Download [employer] Meetings, the blue/green circle. The app is free but as with any other app downloads, their phone may require them to verify saved payment information or Apple password. The patient does NOT have to create an account.  - If Android, ask patient to go to [employer] Play Store and type in  WebEx in the search bar. Download [employer], the blue/green circle. The app is free but as with any other app downloads, their phone may require them to verify saved payment information or Android password. The patient does NOT have to create an account.   CONSENT FOR TELE-HEALTH VISIT - PLEASE REVIEW  I hereby voluntarily request, consent and authorize [facility] and its employed or contracted physicians, physician assistants, nurse practitioners or other licensed health care professionals (the Practitioner), to provide me with telemedicine health  care services (the "Services") as deemed necessary by the treating Practitioner. I acknowledge and consent to receive the Services by the Practitioner via telemedicine. I understand that the telemedicine visit will involve communicating with the Practitioner through live audiovisual communication technology and the disclosure of certain medical information by electronic transmission. I acknowledge that I have been given the opportunity to request an in-person assessment or other available alternative prior to the telemedicine visit and am voluntarily participating in the telemedicine visit.  I understand that I have the right to withhold or withdraw my consent to the use of telemedicine in the course of my care at any time, without affecting my right to future care or treatment, and that the Practitioner or I may terminate the telemedicine visit at any time. I understand that I have the right to inspect all information obtained and/or recorded in the course of the telemedicine visit and may receive copies of available information for a reasonable fee.  I understand that some of the potential risks of receiving the Services via telemedicine include:  Marland Kitchen Delay or interruption in medical evaluation due to technological equipment failure or disruption; . Information transmitted may not be sufficient (e.g. poor resolution of images) to allow for appropriate medical decision making by the Practitioner; and/or  . In rare instances, security protocols could fail, causing a breach of personal health information.  Furthermore, I acknowledge that it is my responsibility to provide information about my medical history, conditions and care that is complete and accurate to the best of my ability. I acknowledge that Practitioner's advice, recommendations, and/or decision may be based on factors not within their control, such as incomplete or inaccurate data provided by me or distortions of diagnostic images or specimens that  may result from electronic transmissions. I understand that the practice of medicine is not an exact science and that Practitioner makes no warranties or guarantees regarding treatment outcomes. I acknowledge that I will receive a copy of this consent concurrently upon execution via email to the email address I last provided but may also request a printed copy by calling the office of [facility].    I understand that my insurance will be billed for this visit.   I have read or had this consent read to me. . I understand the contents of this consent, which adequately explains the benefits and risks of the Services being provided via telemedicine.  . I have been provided ample opportunity to ask questions regarding this consent and the Services and have had my questions answered to my satisfaction. . I give my informed consent for the services to be provided through the use of telemedicine in my medical care  By participating in this telemedicine visit I agree to the above.

## 2018-06-14 ENCOUNTER — Telehealth: Payer: Medicaid Other | Admitting: Internal Medicine

## 2018-08-22 ENCOUNTER — Ambulatory Visit: Payer: Medicaid Other | Admitting: Internal Medicine

## 2019-06-12 ENCOUNTER — Other Ambulatory Visit: Payer: Self-pay

## 2019-06-12 ENCOUNTER — Encounter: Payer: Self-pay | Admitting: Surgery

## 2019-06-12 ENCOUNTER — Ambulatory Visit (INDEPENDENT_AMBULATORY_CARE_PROVIDER_SITE_OTHER): Payer: Medicaid Other | Admitting: Surgery

## 2019-06-12 VITALS — BP 179/109 | HR 93 | Temp 98.8°F | Resp 12 | Wt 150.2 lb

## 2019-06-12 DIAGNOSIS — R1031 Right lower quadrant pain: Secondary | ICD-10-CM

## 2019-06-12 MED ORDER — GABAPENTIN 300 MG PO CAPS: 300.0000 mg | ORAL_CAPSULE | Freq: Three times a day (TID) | ORAL | 0 refills | Status: DC

## 2019-06-12 MED ORDER — TRIAMCINOLONE ACETONIDE 40 MG/ML IJ SUSP
40.0000 mg | Freq: Once | INTRAMUSCULAR | Status: AC
  Administered 2019-06-12: 40 mg via INTRAMUSCULAR

## 2019-06-12 MED ORDER — NAPROXEN 500 MG PO TABS: 500.0000 mg | ORAL_TABLET | Freq: Two times a day (BID) | ORAL | 0 refills | Status: DC

## 2019-06-12 NOTE — Patient Instructions (Addendum)
Please pick up your medications at your local pharmacy. Use ice pack on the area twice a day.   We will see you in 3 weeks for a follow up. Se your appointment below.

## 2019-06-14 NOTE — Progress Notes (Signed)
Outpatient Surgical Follow Up  06/14/2019  Shane Dominguez is an 59 y.o. male.   Chief Complaint  Patient presents with  . Follow-up    Right Groin pain    HPI: Shane Dominguez is a 59 year old male well-known to me with prior bilateral inguinal hernia repair laparoscopically performed by me 2 and half years ago.  He did have preoperative right inguinodynia.  He now reports that over the last couple months his pain is back.  Has intermittent sharp pain that is moderate intensity and radiates to the right scrotum.  No fevers no chills.  No evidence of a bulge.  He continues to smoke and unfortunately is not compliant with medications.  He has been doing regular activities including lifting and he is very active.    Past Medical History:  Diagnosis Date  . Acute appendicitis   . Appendicitis 08/18/2016  . GERD (gastroesophageal reflux disease)    NO MEDS  . Hepatitis C   . Hypertension     Past Surgical History:  Procedure Laterality Date  . HERNIA REPAIR  11/18/2016  . INGUINAL HERNIA REPAIR Bilateral 11/17/2016   Procedure: LAPAROSCOPIC BILATERAL INGUINAL HERNIA REPAIR;  Surgeon: Jules Husbands, MD;  Location: [facility] ORS;  Service: General;  Laterality: Bilateral;  . INSERTION OF MESH Bilateral 11/17/2016   Procedure: INSERTION OF MESH;  Surgeon: Jules Husbands, MD;  Location: [facility] ORS;  Service: General;  Laterality: Bilateral;  . LAPAROSCOPIC APPENDECTOMY N/A 08/18/2016   Procedure: APPENDECTOMY LAPAROSCOPIC;  Surgeon: Jules Husbands, MD;  Location: [facility] ORS;  Service: General;  Laterality: N/A;    Family History  Problem Relation Age of Onset  . Stroke Mother   . COPD Father   . Emphysema Father   . Stroke Brother     Social History:  reports that he quit smoking about 15 months ago. His smoking use included cigarettes. He has a 80.00 pack-year smoking history. He has never used smokeless tobacco. He reports previous alcohol use. He reports that he does not use drugs.  Allergies: No  Known Allergies  Medications reviewed.    ROS Full ROS performed and is otherwise negative other than what is stated in HPI   BP (!) 179/109   Pulse 93   Temp 98.8 F (37.1 C)   Resp 12   Wt 150 lb 3.2 oz (68.1 kg)   SpO2 97%   BMI 22.18 kg/m   Physical Exam Vitals and nursing note reviewed. Exam conducted with a chaperone present.  Constitutional:      General: He is not in acute distress.    Appearance: Normal appearance. He is normal weight.  Eyes:     General: No scleral icterus.       Right eye: No discharge.        Left eye: No discharge.  Pulmonary:     Effort: Pulmonary effort is normal. No respiratory distress.     Breath sounds: No stridor. No wheezing.  Abdominal:     General: Abdomen is flat. There is no distension.     Palpations: There is no mass.     Tenderness: There is no abdominal tenderness. There is no guarding or rebound.     Hernia: No hernia is present.     Comments: There is no evidence of recurrent hernia on either side.  However the right testes is tender to patient.  Genitourinary:    Penis: Normal.      Testes: Normal.  Musculoskeletal:  General: No swelling, tenderness or deformity. Normal range of motion.  Skin:    General: Skin is warm and dry.     Capillary Refill: Capillary refill takes less than 2 seconds.     Coloration: Skin is not jaundiced.  Neurological:     General: No focal deficit present.     Mental Status: He is alert and oriented to person, place, and time.  Psychiatric:        Mood and Affect: Mood normal.        Behavior: Behavior normal.        Thought Content: Thought content normal.        Judgment: Judgment normal.         Assessment/Plan:   Right inguinal pain 2-1/2 years after bilateral inguinal hernia repair.  On exam there is no evidence of hernia recurrence.  This is likely from irritation of the ilioinguinal nerves or the iliohypogastrics.  Cussed with the patient in detail about medical  management to include NSAIDs, ice packs gabapentin.  Patient is also interested in having a block today with steroids.  Described to the patient detail by the procedure.  Procedure note 1.  Right ilioinguinal nerve block using 40 mg of Kenalog with 10 cc of Marcaine quarter percent with epinephrine and 10 cc of lidocaine 1% with epi  After prepping and draping patient in sterile fashion.  Using the landmark and the ASIS I injected medial and superior to this multiple times.  The total volume of 20 cc was injected.  The patient had spectacular results after the the block.    Greater than 50% of the 25 minutes  visit was spent in counseling/coordination of care   Sterling Big, MD [facility] General Surgeon

## 2019-06-19 ENCOUNTER — Encounter: Payer: Self-pay | Admitting: *Deleted

## 2019-06-19 ENCOUNTER — Other Ambulatory Visit: Payer: Self-pay

## 2019-06-19 ENCOUNTER — Emergency Department: Payer: Medicaid Other

## 2019-06-19 ENCOUNTER — Telehealth: Payer: Self-pay | Admitting: Surgery

## 2019-06-19 DIAGNOSIS — R519 Headache, unspecified: Secondary | ICD-10-CM | POA: Diagnosis not present

## 2019-06-19 DIAGNOSIS — Z79899 Other long term (current) drug therapy: Secondary | ICD-10-CM | POA: Insufficient documentation

## 2019-06-19 DIAGNOSIS — K219 Gastro-esophageal reflux disease without esophagitis: Secondary | ICD-10-CM | POA: Diagnosis not present

## 2019-06-19 DIAGNOSIS — R070 Pain in throat: Secondary | ICD-10-CM | POA: Diagnosis present

## 2019-06-19 DIAGNOSIS — R42 Dizziness and giddiness: Secondary | ICD-10-CM | POA: Diagnosis not present

## 2019-06-19 DIAGNOSIS — F17200 Nicotine dependence, unspecified, uncomplicated: Secondary | ICD-10-CM | POA: Diagnosis not present

## 2019-06-19 DIAGNOSIS — Z9114 Patient's other noncompliance with medication regimen: Secondary | ICD-10-CM | POA: Diagnosis not present

## 2019-06-19 DIAGNOSIS — R0602 Shortness of breath: Secondary | ICD-10-CM | POA: Diagnosis not present

## 2019-06-19 DIAGNOSIS — I10 Essential (primary) hypertension: Secondary | ICD-10-CM | POA: Diagnosis not present

## 2019-06-19 LAB — CBC
HCT: 45.4 % (ref 39.0–52.0)
Hemoglobin: 16 g/dL (ref 13.0–17.0)
MCH: 33.9 pg (ref 26.0–34.0)
MCHC: 35.2 g/dL (ref 30.0–36.0)
MCV: 96.2 fL (ref 80.0–100.0)
Platelets: 250 10*3/uL (ref 150–400)
RBC: 4.72 MIL/uL (ref 4.22–5.81)
RDW: 12.1 % (ref 11.5–15.5)
WBC: 9.5 10*3/uL (ref 4.0–10.5)
nRBC: 0 % (ref 0.0–0.2)

## 2019-06-19 LAB — BASIC METABOLIC PANEL
Anion gap: 7 (ref 5–15)
BUN: 15 mg/dL (ref 6–20)
CO2: 30 mmol/L (ref 22–32)
Calcium: 9.3 mg/dL (ref 8.9–10.3)
Chloride: 99 mmol/L (ref 98–111)
Creatinine, Ser: 0.66 mg/dL (ref 0.61–1.24)
GFR calc Af Amer: >60 mL/min (ref >60)
GFR calc non Af Amer: >60 mL/min (ref >60)
Glucose, Bld: 131 mg/dL — ABNORMAL HIGH (ref 70–99)
Potassium: 4.5 mmol/L (ref 3.5–5.1)
Sodium: 136 mmol/L (ref 135–145)

## 2019-06-19 LAB — TROPONIN I (HIGH SENSITIVITY): Troponin I (High Sensitivity): 5 ng/L (ref <18)

## 2019-06-19 MED ORDER — SODIUM CHLORIDE 0.9% FLUSH: 3.0000 mL | Freq: Once | INTRAVENOUS | Status: DC

## 2019-06-19 NOTE — Telephone Encounter (Signed)
Pt states he feels like his abdomen is swollen from where Dr Everlene Farrier injected the nerve block injection. States he is constipated. Pt advised to do ice packs for swelling and to take miralax to help with constipation. Pt voiced understanding and had moved appt f/u earlier to see Dr Everlene Farrier 4/19.

## 2019-06-19 NOTE — Telephone Encounter (Signed)
Patient's friend Lynden Ang is calling and is asking for one of the nurses to give her a call. Please call patient and advise.

## 2019-06-19 NOTE — ED Triage Notes (Signed)
Pt brought in via ems from home.  Pt reports dizziness and acid reflux.  Denies chest pain.  Pt has intermittent sob.  Sx for 1 day.  Pt alert.

## 2019-06-19 NOTE — ED Notes (Signed)
First nurse note: Pt presents to ED from home via EMS with c/o dizziness and SOB for the past week.

## 2019-06-20 ENCOUNTER — Emergency Department
Admission: EM | Admit: 2019-06-20 | Discharge: 2019-06-20 | Disposition: A | Discharge status: Home / Self Care | Payer: Medicaid Other | Attending: Emergency Medicine | Admitting: Emergency Medicine

## 2019-06-20 DIAGNOSIS — R42 Dizziness and giddiness: Secondary | ICD-10-CM

## 2019-06-20 DIAGNOSIS — I10 Essential (primary) hypertension: Secondary | ICD-10-CM

## 2019-06-20 DIAGNOSIS — K219 Gastro-esophageal reflux disease without esophagitis: Secondary | ICD-10-CM

## 2019-06-20 MED ORDER — OMEPRAZOLE MAGNESIUM 20 MG PO TBEC: 20.0000 mg | DELAYED_RELEASE_TABLET | Freq: Every day | ORAL | 1 refills | Status: DC

## 2019-06-20 MED ORDER — AMLODIPINE BESYLATE 5 MG PO TABS
5.0000 mg | ORAL_TABLET | Freq: Once | ORAL | Status: AC
  Administered 2019-06-20: 5 mg via ORAL
  Filled 2019-06-20: qty 1

## 2019-06-20 MED ORDER — AMLODIPINE BESYLATE 5 MG PO TABS: 5.0000 mg | ORAL_TABLET | Freq: Every day | ORAL | 11 refills | Status: DC

## 2019-06-20 MED ORDER — LIDOCAINE VISCOUS HCL 2 % MT SOLN
15.0000 mL | Freq: Once | OROMUCOSAL | Status: AC
  Administered 2019-06-20: 15 mL via ORAL
  Filled 2019-06-20: qty 15

## 2019-06-20 MED ORDER — ALUM & MAG HYDROXIDE-SIMETH 200-200-20 MG/5ML PO SUSP
30.0000 mL | Freq: Once | ORAL | Status: AC
  Administered 2019-06-20: 30 mL via ORAL
  Filled 2019-06-20: qty 30

## 2019-06-20 NOTE — ED Provider Notes (Signed)
[facility] Emergency Department Provider Note  ____________________________________________   First MD Initiated Contact with Patient 06/20/19 [phone]     (approximate)  I have reviewed the triage vital signs and the nursing notes.   HISTORY  Chief Complaint Dizziness    HPI Shane Dominguez is a 59 y.o. male with medical history as listed below which also includes noncompliance with his medications (particularly antihypertensives) and continued tobacco use.  He presents tonight for a variety of symptoms that includes a headache, an episode of dizziness, and some persistent throat and abdominal burning that he associates with acid reflux.  He said that he was recently seen by his surgeon for an inguinal hernia and was told that he could not take any antacids because of some other medicine that he was taking.  Since then his symptoms are gotten worse.  He is not having any issues with the hernia right now but mostly is bothered by the burning sensation in his throat.  The dizziness has resolved but he still feels some discomfort in his throat and upper abdomen.  He denies to me having any trouble breathing and denies a cough.  He denies fever, lower abdominal pain, and dysuria.  He feels better now after more than 7 h in the emergency department but he says he would like to be back on his blood pressure medicine which she stopped taking voluntarily about a year ago after Dr. Okey Dupre prescribed amlodipine 5 mg daily.  He said he asked about "natural alternatives" to the medication and he was told that he could try taking walnuts, but he said he does not know how to eat walnuts, so he has not been taking anything.  Overall he described his symptoms as acute in onset and severe and nothing in particular made them better or worse but he feels better now except for the burning sensation.         Past Medical History:  Diagnosis Date  . Acute appendicitis   . Appendicitis  08/18/2016  . GERD (gastroesophageal reflux disease)    NO MEDS  . Hepatitis C   . Hypertension     Patient Active Problem List   Diagnosis Date Noted  . Atypical chest pain 12/20/2017  . Uncontrolled hypertension   . Bilateral inguinal hernia without obstruction or gangrene 09/01/2016  . GERD (gastroesophageal reflux disease) 09/01/2016  . Appendicitis 08/18/2016  . Acute appendicitis     Past Surgical History:  Procedure Laterality Date  . HERNIA REPAIR  11/18/2016  . INGUINAL HERNIA REPAIR Bilateral 11/17/2016   Procedure: LAPAROSCOPIC BILATERAL INGUINAL HERNIA REPAIR;  Surgeon: Leafy Ro, MD;  Location: [facility] ORS;  Service: General;  Laterality: Bilateral;  . INSERTION OF MESH Bilateral 11/17/2016   Procedure: INSERTION OF MESH;  Surgeon: Leafy Ro, MD;  Location: [facility] ORS;  Service: General;  Laterality: Bilateral;  . LAPAROSCOPIC APPENDECTOMY N/A 08/18/2016   Procedure: APPENDECTOMY LAPAROSCOPIC;  Surgeon: Leafy Ro, MD;  Location: [facility] ORS;  Service: General;  Laterality: N/A;    Prior to Admission medications   Medication Sig Start Date End Date Taking? Authorizing Provider  amLODipine (NORVASC) 5 MG tablet Take 1 tablet (5 mg total) by mouth daily. 06/20/19 06/19/20  Loleta Rose, MD  gabapentin (NEURONTIN) 300 MG capsule Take 1 capsule (300 mg total) by mouth 3 (three) times daily. 06/12/19   Pabon, Merri Ray, MD  Multiple Vitamin (MULTIVITAMIN) tablet Take 1 tablet by mouth daily.    [provider]  naproxen (NAPROSYN) 500 MG tablet Take 1 tablet (500 mg total) by mouth 2 (two) times daily with a meal. 06/12/19 06/11/20  Pabon, Diego F, MD  omeprazole (PRILOSEC OTC) 20 MG tablet Take 1 tablet (20 mg total) by mouth daily. 06/20/19 06/19/20  Loleta Rose, MD    Allergies Patient has no known allergies.  Family History  Problem Relation Age of Onset  . Stroke Mother   . COPD Father   . Emphysema Father   . Stroke Brother     Social History Social  History   Tobacco Use  . Smoking status: Former Smoker    Packs/day: 2.00    Years: 40.00    Pack years: 80.00    Types: Cigarettes    Quit date: 02/16/2018    Years since quitting: 1.3  . Smokeless tobacco: Never Used  Substance Use Topics  . Alcohol use: Not Currently  . Drug use: No    Review of Systems Constitutional: No fever/chills Eyes: No visual changes. ENT: Burning sensation in his throat. Cardiovascular: Denies chest pain. Respiratory: Denies shortness of breath. Gastrointestinal: Some burning upper abdominal pain..  No nausea, no vomiting.  No diarrhea.  No constipation. Genitourinary: Negative for dysuria. Musculoskeletal: Negative for neck pain.  Negative for back pain. Integumentary: Negative for rash. Neurological: Episode of dizziness.  Negative for headaches, focal weakness or numbness.   ____________________________________________   PHYSICAL EXAM:  VITAL SIGNS: ED Triage Vitals  Enc Vitals Group     BP 06/19/19 2215 (!) 184/86     Pulse Rate 06/19/19 2215 67     Resp 06/19/19 2215 18     Temp 06/19/19 2215 99.2 F (37.3 C)     Temp Source 06/19/19 2215 Oral     SpO2 06/19/19 2215 98 %     Weight 06/19/19 2217 68 kg (150 lb)     Height 06/19/19 2217 1.753 m (5\' 9" )     Head Circumference --      Peak Flow --      Pain Score 06/19/19 2217 0     Pain Loc --      Pain Edu? --      Excl. in GC? --     Constitutional: Alert and oriented.  Patient does not appear to be in severe distress. Eyes: Conjunctivae are normal.  Head: Atraumatic. Nose: No congestion/rhinnorhea. Mouth/Throat: Patient is wearing a mask. Neck: No stridor.  No meningeal signs.   Cardiovascular: Normal rate, regular rhythm. Good peripheral circulation. Grossly normal heart sounds. Respiratory: Normal respiratory effort.  No retractions. Gastrointestinal: Soft and nontender. No distention.  Musculoskeletal: No lower extremity tenderness nor edema. No gross deformities of  extremities. Neurologic:  Normal speech and language. No gross focal neurologic deficits are appreciated.  Skin:  Skin is warm, dry and intact.  ____________________________________________   LABS (all labs ordered are listed, but only abnormal results are displayed)  Labs Reviewed  BASIC METABOLIC PANEL - Abnormal; Notable for the following components:      Result Value   Glucose, Bld 131 (*)    All other components within normal limits  CBC  TROPONIN I (HIGH SENSITIVITY)   ____________________________________________  EKG  ED ECG REPORT I, 2218, the attending physician, personally viewed and interpreted this ECG.  Date: 06/19/2019 EKG Time: 22: 24 Rate: 68 Rhythm: normal sinus rhythm QRS Axis: normal Intervals: Early repolarization, no acute abnormalities ST/T Wave abnormalities: Non-specific ST segment / T-wave changes, but no clear evidence  of acute ischemia. Narrative Interpretation: no definitive evidence of acute ischemia; does not meet STEMI criteria.  No significant change from prior EKG on 03/20/2018.   ____________________________________________  RADIOLOGY I, Hinda Kehr, personally viewed and evaluated these images (plain radiographs) as part of my medical decision making, as well as reviewing the written report by the radiologist.  ED MD interpretation: No acute abnormality identified on chest x-ray.  Official radiology report(s): DG Chest 2 View  Result Date: 06/19/2019 CLINICAL DATA:  Dizziness, acid reflux EXAM: CHEST - 2 VIEW COMPARISON:  12/19/2017 FINDINGS: Frontal and lateral views of the chest demonstrate an unremarkable cardiac silhouette. No airspace disease, effusion, or pneumothorax. Stable parenchymal scarring. No acute bony abnormalities. IMPRESSION: 1. Stable exam, no acute process. Electronically Signed   By: Randa Ngo M.D.   On: 06/19/2019 22:33    ____________________________________________   PROCEDURES   Procedure(s)  performed (including Critical Care):  Procedures   ____________________________________________   INITIAL IMPRESSION / MDM / [place] / ED COURSE  As part of my medical decision making, I reviewed the following data within the [place] notes reviewed and incorporated, Labs reviewed , EKG interpreted , Old chart reviewed, Radiograph reviewed , Notes from prior ED visits and [place] Controlled Substance Database   Differential diagnosis includes, but is not limited to, acid reflux, medication or drug side effect, noncompliance, dehydration, near syncope, cardiac arrhythmias.  The patient is generally well-appearing and in no distress.  Vital signs are stable except for hypertension.  I personally reviewed his x-ray which shows no acute abnormalities and I reviewed his EKG which is unchanged from his previous EKG from about 15 months ago and shows no sign of ischemia, just some early repolarization.  His basic metabolic panel and CBC are normal and he has a high-sensitivity troponin of 5.  Very low suspicion for ACS.  I reviewed the medical record including his recent visit with Dr. Dahlia Byes and I can find no reason that he should not be taking an antacid.  I gave him a GI cocktail with viscous lidocaine tonight and recommended that he take Prilosec and I restarted him on his amlodipine.  He agrees with this plan and will follow up as an outpatient.  I gave my usual and customary return precautions.  No evidence of an emergent medical condition tonight.             ____________________________________________  FINAL CLINICAL IMPRESSION(S) / ED DIAGNOSES  Final diagnoses:  Gastroesophageal reflux disease, unspecified whether esophagitis present  Essential hypertension  Dizziness     MEDICATIONS GIVEN DURING THIS VISIT:  Medications  sodium chloride flush (NS) 0.9 % injection 3 mL (has no administration in time range)  alum & mag hydroxide-simeth  (MAALOX/MYLANTA) 200-200-20 MG/5ML suspension 30 mL (30 mLs Oral Given 06/20/19 0527)    And  lidocaine (XYLOCAINE) 2 % viscous mouth solution 15 mL (15 mLs Oral Given 06/20/19 0527)  amLODipine (NORVASC) tablet 5 mg (5 mg Oral Given 06/20/19 0527)     ED Discharge Orders         Ordered    omeprazole (PRILOSEC OTC) 20 MG tablet  Daily     06/20/19 0530    amLODipine (NORVASC) 5 MG tablet  Daily     06/20/19 0530          *Please note:  Derren C Goeden was evaluated in Emergency Department on 06/20/2019 for the symptoms described in the history of present illness. He  was evaluated in the context of the global COVID-19 pandemic, which necessitated consideration that the patient might be at risk for infection with the SARS-CoV-2 virus that causes COVID-19. Institutional protocols and algorithms that pertain to the evaluation of patients at risk for COVID-19 are in a state of rapid change based on information released by regulatory bodies including the CDC and federal and state organizations. These policies and algorithms were followed during the patient's care in the ED.  Some ED evaluations and interventions may be delayed as a result of limited staffing during the pandemic.*  Note:  This document was prepared using Dragon voice recognition software and may include unintentional dictation errors.   Loleta Rose, MD 06/20/19 0530

## 2019-06-20 NOTE — ED Notes (Signed)
Patient discharged to home per MD order. Patient in stable condition, and deemed medically cleared by ED provider for discharge. Discharge instructions reviewed with patient/family using "Teach Back"; verbalized understanding of medication education and administration, and information about follow-up care. Denies further concerns. ° °

## 2019-06-20 NOTE — ED Notes (Signed)
Pt in with co episode of throat burning yesterday, states is normally on reflux meds but was told to hold due to Right ilioinguinal nerve block done 4/7. States burning to throat has improved rates it 7/10 at this time. Also co left temporal headache, and co shob during episode due to throat burning.

## 2019-06-20 NOTE — Discharge Instructions (Addendum)
As we discussed, your work-up was reassuring tonight.  I believe you should be back on your blood pressure medication which I have ordered for you but I encourage you to follow-up with Dr. Okey Dupre for continued care.  There is no reason you cannot take an antacid medicine and I think it will help your symptoms, so please use the prescription for Prilosec.  If it is working for you after the 1st month, you can continue to buy it as an over-the-counter generic medication.  Please continue to take your other medications and drink plenty of fluids to stay hydrated.  Return to the emergency department if you develop new or worsening symptoms that concern you.

## 2019-06-24 ENCOUNTER — Other Ambulatory Visit: Payer: Self-pay

## 2019-06-24 ENCOUNTER — Encounter: Payer: Self-pay | Admitting: Surgery

## 2019-06-24 ENCOUNTER — Ambulatory Visit (INDEPENDENT_AMBULATORY_CARE_PROVIDER_SITE_OTHER): Payer: Medicaid Other | Admitting: Surgery

## 2019-06-24 VITALS — BP 170/107 | HR 99 | Temp 97.2°F | Resp 12 | Ht 73.0 in | Wt 149.6 lb

## 2019-06-24 DIAGNOSIS — R1031 Right lower quadrant pain: Secondary | ICD-10-CM

## 2019-06-24 NOTE — Patient Instructions (Signed)
Continue to ice the area. Follow up as needed. Call the office if you have any questions or concerns.

## 2019-06-24 NOTE — Progress Notes (Signed)
Outpatient Surgical Follow Up  06/24/2019  Shane Dominguez is an 59 y.o. male.   Chief Complaint  Patient presents with  . Follow-up     Follow Up: Right Groin pain    HPI: 59 year old well-known to me with a prior history of bilateral inguinal hernia repair and presents with right inguinodynia that responded to medical management.  He did have ilioinguinal nerve block with a spectacular results.  He could not tolerate gabapentin.  He is applying ice.  No fevers no chills.  He did want to the emergency room due to some cough and allergies.  Past Medical History:  Diagnosis Date  . Acute appendicitis   . Appendicitis 08/18/2016  . GERD (gastroesophageal reflux disease)    NO MEDS  . Hepatitis C   . Hypertension     Past Surgical History:  Procedure Laterality Date  . HERNIA REPAIR  11/18/2016  . INGUINAL HERNIA REPAIR Bilateral 11/17/2016   Procedure: LAPAROSCOPIC BILATERAL INGUINAL HERNIA REPAIR;  Surgeon: Leafy Ro, MD;  Location: [facility] ORS;  Service: General;  Laterality: Bilateral;  . INSERTION OF MESH Bilateral 11/17/2016   Procedure: INSERTION OF MESH;  Surgeon: Leafy Ro, MD;  Location: [facility] ORS;  Service: General;  Laterality: Bilateral;  . LAPAROSCOPIC APPENDECTOMY N/A 08/18/2016   Procedure: APPENDECTOMY LAPAROSCOPIC;  Surgeon: Leafy Ro, MD;  Location: [facility] ORS;  Service: General;  Laterality: N/A;    Family History  Problem Relation Age of Onset  . Stroke Mother   . COPD Father   . Emphysema Father   . Stroke Brother     Social History:  reports that he quit smoking about 16 months ago. His smoking use included cigarettes. He has a 80.00 pack-year smoking history. He has never used smokeless tobacco. He reports previous alcohol use. He reports that he does not use drugs.  Allergies: No Known Allergies  Medications reviewed.    ROS Full ROS performed and is otherwise negative other than what is stated in HPI   BP (!) 170/107   Pulse 99    Temp (!) 97.2 F (36.2 C)   Resp 12   Ht 6\' 1"  (1.854 m)   Wt 149 lb 9.6 oz (67.9 kg)   SpO2 97%   BMI 19.74 kg/m   Physical Exam NAD, alert Abd: soft, no hernias, no recurrence or infection. Very minimal discomfort on right inguinal area.     Assessment/Plan:  Right inguinodynia without evidence of hernia recurrence.  He responded to medical management and to ilioinguinal nerve block.  Continue NSAIDs and ice.  He did not tolerate the gabapentin very well.  Follow-up on a as needed basis.  I did discuss with him that he may need another ilioinguinal nerve block if his symptoms recur  Greater than 50% of the 15 minutes  visit was spent in counseling/coordination of care   , MD [facility] General Surgeon

## 2019-06-26 ENCOUNTER — Ambulatory Visit: Payer: Medicaid Other | Admitting: Nurse Practitioner

## 2019-06-27 ENCOUNTER — Telehealth: Payer: Self-pay

## 2019-06-27 ENCOUNTER — Encounter: Payer: Self-pay | Admitting: Internal Medicine

## 2019-06-27 ENCOUNTER — Telehealth (INDEPENDENT_AMBULATORY_CARE_PROVIDER_SITE_OTHER): Payer: Medicaid Other | Admitting: Internal Medicine

## 2019-06-27 ENCOUNTER — Other Ambulatory Visit: Payer: Self-pay

## 2019-06-27 ENCOUNTER — Telehealth: Payer: Self-pay | Admitting: *Deleted

## 2019-06-27 VITALS — BP 176/111 | HR 80 | Ht 73.0 in | Wt 149.0 lb

## 2019-06-27 DIAGNOSIS — I10 Essential (primary) hypertension: Secondary | ICD-10-CM

## 2019-06-27 DIAGNOSIS — R0789 Other chest pain: Secondary | ICD-10-CM | POA: Diagnosis not present

## 2019-06-27 MED ORDER — AMLODIPINE BESYLATE 10 MG PO TABS: 10.0000 mg | ORAL_TABLET | Freq: Every day | ORAL | 2 refills | Status: DC

## 2019-06-27 NOTE — Progress Notes (Signed)
Virtual Visit via Telephone Note   This visit type was conducted due to national recommendations for restrictions regarding the COVID-19 Pandemic (e.g. social distancing) in an effort to limit this patient's exposure and mitigate transmission in our community.  Due to his co-morbid illnesses, this patient is at least at moderate risk for complications without adequate follow up.  This format is felt to be most appropriate for this patient at this time.  The patient did not have access to video technology/had technical difficulties with video requiring transitioning to audio format only (telephone).  All issues noted in this document were discussed and addressed.  No physical exam could be performed with this format.  Please refer to the patient's chart for his  consent to telehealth for [facility].   The patient was identified using 2 identifiers.  Date:  06/27/2019   ID:  Shane Dominguez, DOB December 23, 1960, MRN [id number]  Patient Location: Home Provider Location: Home  PCP:  Patient, No Pcp Per  Cardiologist:  [name], MD Electrophysiologist:  None   Evaluation Performed:  Follow-Up Visit  Chief Complaint:  Elevated blood pressure  History of Present Illness:    Shane Dominguez is a 59 y.o. male with history of hypertension, GERD, and hepatitis C, who presents for follow-up of chest pain.  I last saw him in January, at which time he was feeling well other than occasional heartburn treated with as needed Tums.  Further cardiac testing was deferred given atypical nature of chest pain, though it was noted that if symptoms persist, further ischemia evaluation may be necessary given possible scar versus artifact noted on myocardial perfusion stress test last year.  Blood pressure was also poorly controlled at that time in the setting of medication noncompliance.  I advised Shane Dominguez to begin taking amlodipine 5 mg daily and to limit his sodium intake.  He return for blood pressure  check a month later, at which time his blood pressure was still elevated in the setting of having yet to begin amlodipine.  Today, Shane Dominguez reports that he is feeling well.  He states that he presented to the ED last week because of swelling in his throat after taking Mylanta and another remedy with aloe for treatment of reflux.  He had also been started on gabapentin earlier this month for pain related to right inguinal hernia repair; he is no longer taking this.  The symptoms related to his through and inguinal hernia have resolved. He is taking amlodipine 5 mg daily without side effects.  He denies further chest pain, as well as shortness of breath, palpitations, lightheadedness, and edema.  He has cut back but not eliminated salting his food.  The patient does not have symptoms concerning for COVID-19 infection (fever, chills, cough, or new shortness of breath).    Past Medical History:  Diagnosis Date  . Acute appendicitis   . Appendicitis 08/18/2016  . GERD (gastroesophageal reflux disease)    NO MEDS  . Hepatitis C   . Hypertension    Past Surgical History:  Procedure Laterality Date  . HERNIA REPAIR  11/18/2016  . INGUINAL HERNIA REPAIR Bilateral 11/17/2016   Procedure: LAPAROSCOPIC BILATERAL INGUINAL HERNIA REPAIR;  Surgeon: Leafy Ro, MD;  Location: [facility] ORS;  Service: General;  Laterality: Bilateral;  . INSERTION OF MESH Bilateral 11/17/2016   Procedure: INSERTION OF MESH;  Surgeon: Leafy Ro, MD;  Location: [facility] ORS;  Service: General;  Laterality: Bilateral;  . LAPAROSCOPIC APPENDECTOMY N/A  08/18/2016   Procedure: APPENDECTOMY LAPAROSCOPIC;  Surgeon: Leafy Ro, MD;  Location: [facility] ORS;  Service: General;  Laterality: N/A;     Current Meds  Medication Sig  . loratadine (CLARITIN) 10 MG tablet Take 10 mg by mouth daily.  . Multiple Vitamin (MULTIVITAMIN) tablet Take 1 tablet by mouth daily.  Marland Kitchen omeprazole (PRILOSEC OTC) 20 MG tablet Take 1 tablet (20 mg total)  by mouth daily.  . [DISCONTINUED] amLODipine (NORVASC) 5 MG tablet Take 1 tablet (5 mg total) by mouth daily.     Allergies:   Patient has no known allergies.   Social History   Tobacco Use  . Smoking status: Former Smoker    Packs/day: 2.00    Years: 40.00    Pack years: 80.00    Types: Cigarettes    Quit date: 02/16/2018    Years since quitting: 1.3  . Smokeless tobacco: Never Used  Substance Use Topics  . Alcohol use: Not Currently  . Drug use: No     Family Hx: The patient's family history includes COPD in his father; Emphysema in his father; Stroke in his brother and mother.  ROS:   Please see the history of present illness.   All other systems reviewed and are negative.   Prior CV studies:   The following studies were reviewed today:  None  Labs/Other Tests and Data Reviewed:    EKG:  No ECG reviewed.  Recent Labs: 06/19/2019: BUN 15; Creatinine, Ser 0.66; Hemoglobin 16.0; Platelets 250; Potassium 4.5; Sodium 136   Recent Lipid Panel Lab Results  Component Value Date/Time   CHOL 131 12/20/2017 05:31 AM   TRIG 57 12/20/2017 05:31 AM   HDL 35 (L) 12/20/2017 05:31 AM   CHOLHDL 3.7 12/20/2017 05:31 AM   LDLCALC 85 12/20/2017 05:31 AM    Wt Readings from Last 3 Encounters:  06/27/19 149 lb (67.6 kg)  06/24/19 149 lb 9.6 oz (67.9 kg)  06/19/19 150 lb (68 kg)     Objective:    Vital Signs:  BP (!) 176/111 (BP Location: Left Arm, Patient Position: Sitting)   Pulse 80   Ht 6\' 1"  (1.854 m)   Wt 149 lb (67.6 kg)   BMI 19.66 kg/m    VITAL SIGNS:  reviewed  ASSESSMENT & PLAN:    Hypertension: Blood pressure remains poorly controlled with recent initiation of amlodipine (had been prescribed several times but Shane Dominguez had been unwilling to start the medication).  I have recommended increasing amlodipine to 10 mg daily as well as minimizing sodium intake.  He will likely need additional medications in the future; I would favor the addition of a thiazide  diuretic as the next-line agent.  Chest pain: No further symptoms.  Defer additional workup at this time.  Shane Dominguez needs improved blood pressure control, as outlined above.  Follow-up: Return to clinic in 1 month.  Time:   Today, I have spent 12 minutes with the patient with telehealth technology discussing the above problems.     Medication Adjustments/Labs and Tests Ordered: Current medicines are reviewed at length with the patient today.  Concerns regarding medicines are outlined above.   Tests Ordered: None  Medication Changes: Meds ordered this encounter  Medications  . amLODipine (NORVASC) 10 MG tablet    Sig: Take 1 tablet (10 mg total) by mouth daily.    Dispense:  30 tablet    Refill:  2    Follow Up:  In Person in 1 month(s)  Signed, Nelva Bush, MD  06/27/2019 2:04 PM    [place]

## 2019-06-27 NOTE — Telephone Encounter (Signed)
     Patient Consent for Virtual Visit         Shane Dominguez has provided verbal consent on 06/27/2019 for a virtual visit (video or telephone).   CONSENT FOR VIRTUAL VISIT FOR:  Shane Dominguez  By participating in this virtual visit I agree to the following:  I hereby voluntarily request, consent and authorize [facility] and its employed or contracted physicians, Producer, television/film/video, nurse practitioners or other licensed health care professionals (the Practitioner), to provide me with telemedicine health care services (the "Services") as deemed necessary by the treating Practitioner. I acknowledge and consent to receive the Services by the Practitioner via telemedicine. I understand that the telemedicine visit will involve communicating with the Practitioner through live audiovisual communication technology and the disclosure of certain medical information by electronic transmission. I acknowledge that I have been given the opportunity to request an in-person assessment or other available alternative prior to the telemedicine visit and am voluntarily participating in the telemedicine visit.  I understand that I have the right to withhold or withdraw my consent to the use of telemedicine in the course of my care at any time, without affecting my right to future care or treatment, and that the Practitioner or I may terminate the telemedicine visit at any time. I understand that I have the right to inspect all information obtained and/or recorded in the course of the telemedicine visit and may receive copies of available information for a reasonable fee.  I understand that some of the potential risks of receiving the Services via telemedicine include:  Marland Kitchen Delay or interruption in medical evaluation due to technological equipment failure or disruption; . Information transmitted may not be sufficient (e.g. poor resolution of images) to allow for appropriate medical decision making by the  Practitioner; and/or  . In rare instances, security protocols could fail, causing a breach of personal health information.  Furthermore, I acknowledge that it is my responsibility to provide information about my medical history, conditions and care that is complete and accurate to the best of my ability. I acknowledge that Practitioner's advice, recommendations, and/or decision may be based on factors not within their control, such as incomplete or inaccurate data provided by me or distortions of diagnostic images or specimens that may result from electronic transmissions. I understand that the practice of medicine is not an exact science and that Practitioner makes no warranties or guarantees regarding treatment outcomes. I acknowledge that a copy of this consent can be made available to me via my patient portal [facility] MyChart), or I can request a printed copy by calling the office of [facility].    I understand that my insurance will be billed for this visit.   I have read or had this consent read to me. . I understand the contents of this consent, which adequately explains the benefits and risks of the Services being provided via telemedicine.  . I have been provided ample opportunity to ask questions regarding this consent and the Services and have had my questions answered to my satisfaction. . I give my informed consent for the services to be provided through the use of telemedicine in my medical care

## 2019-06-27 NOTE — Telephone Encounter (Signed)
Left message with patient's DPR to go over AVS from virtual visit today.

## 2019-06-27 NOTE — Patient Instructions (Addendum)
Medication Instructions:  Your physician has recommended you make the following change in your medication:  INCREASE Amlodipine to 10 mg by mouth once a day.  *If you need a refill on your cardiac medications before your next appointment, please call your pharmacy*  Follow-Up: At [facility], you and your health needs are our priority.  As part of our continuing mission to provide you with exceptional heart care, we have created designated Provider Care Teams.  These Care Teams include your primary Cardiologist (physician) and Advanced Practice Providers (APPs -  Physician Assistants and Nurse Practitioners) who all work together to provide you with the care you need, when you need it.  We recommend signing up for the patient portal called "MyChart".  Sign up information is provided on this After Visit Summary.  MyChart is used to connect with patients for Virtual Visits (Telemedicine).  Patients are able to view lab/test results, encounter notes, upcoming appointments, etc.  Non-urgent messages can be sent to your provider as well.   To learn more about what you can do with MyChart, go to [email or web address].    Your next appointment:   1 month(s)  The format for your next appointment:   In Person  Provider:    You may see one of the following Advanced Practice Providers on your designated Care Team:    Nicolasa Ducking, NP  Eula Listen, PA-C  Marisue Ivan, PA-C    Other Instructions Please limit your intake of sodium and consider to stop smoking.    Steps to Quit Smoking Smoking tobacco is the leading cause of preventable death. It can affect almost every organ in the body. Smoking puts you and people around you at risk for many serious, long-lasting (chronic) diseases. Quitting smoking can be hard, but it is one of the best things that you can do for your health. It is never too late to quit. How do I get ready to quit? When you decide to quit smoking, make a plan  to help you succeed. Before you quit:  Pick a date to quit. Set a date within the next 2 weeks to give you time to prepare.  Write down the reasons why you are quitting. Keep this list in places where you will see it often.  Tell your family, friends, and co-workers that you are quitting. Their support is important.  Talk with your doctor about the choices that may help you quit.  Find out if your health insurance will pay for these treatments.  Know the people, places, things, and activities that make you want to smoke (triggers). Avoid them. What first steps can I take to quit smoking?  Throw away all cigarettes at home, at work, and in your car.  Throw away the things that you use when you smoke, such as ashtrays and lighters.  Clean your car. Make sure to empty the ashtray.  Clean your home, including curtains and carpets. What can I do to help me quit smoking? Talk with your doctor about taking medicines and seeing a counselor at the same time. You are more likely to succeed when you do both.  If you are pregnant or breastfeeding, talk with your doctor about counseling or other ways to quit smoking. Do not take medicine to help you quit smoking unless your doctor tells you to do so. To quit smoking: Quit right away  Quit smoking totally, instead of slowly cutting back on how much you smoke over a period of time.  Go  to counseling. You are more likely to quit if you go to counseling sessions regularly. Take medicine You may take medicines to help you quit. Some medicines need a prescription, and some you can buy over-the-counter. Some medicines may contain a drug called nicotine to replace the nicotine in cigarettes. Medicines may:  Help you to stop having the desire to smoke (cravings).  Help to stop the problems that come when you stop smoking (withdrawal symptoms). Your doctor may ask you to use:  Nicotine patches, gum, or lozenges.  Nicotine inhalers or  sprays.  Non-nicotine medicine that is taken by mouth. Find resources Find resources and other ways to help you quit smoking and remain smoke-free after you quit. These resources are most helpful when you use them often. They include:  Online chats with a Veterinary surgeon.  Phone quitlines.  Printed Materials engineer.  Support groups or group counseling.  Text messaging programs.  Mobile phone apps. Use apps on your mobile phone or tablet that can help you stick to your quit plan. There are many free apps for mobile phones and tablets as well as websites. Examples include Quit Guide from the [employer] and smokefree.gov  What things can I do to make it easier to quit?   Talk to your family and friends. Ask them to support and encourage you.  Call a phone quitline (1-800-QUIT-NOW), reach out to support groups, or work with a Veterinary surgeon.  Ask people who smoke to not smoke around you.  Avoid places that make you want to smoke, such as: ? Bars. ? Parties. ? Smoke-break areas at work.  Spend time with people who do not smoke.  Lower the stress in your life. Stress can make you want to smoke. Try these things to help your stress: ? Getting regular exercise. ? Doing deep-breathing exercises. ? Doing yoga. ? Meditating. ? Doing a body scan. To do this, close your eyes, focus on one area of your body at a time from head to toe. Notice which parts of your body are tense. Try to relax the muscles in those areas. How will I feel when I quit smoking? Day 1 to 3 weeks Within the first 24 hours, you may start to have some problems that come from quitting tobacco. These problems are very bad 2-3 days after you quit, but they do not often last for more than 2-3 weeks. You may get these symptoms:  Mood swings.  Feeling restless, nervous, angry, or annoyed.  Trouble concentrating.  Dizziness.  Strong desire for high-sugar foods and nicotine.  Weight gain.  Trouble pooping  (constipation).  Feeling like you may vomit (nausea).  Coughing or a sore throat.  Changes in how the medicines that you take for other issues work in your body.  Depression.  Trouble sleeping (insomnia). Week 3 and afterward After the first 2-3 weeks of quitting, you may start to notice more positive results, such as:  Better sense of smell and taste.  Less coughing and sore throat.  Slower heart rate.  Lower blood pressure.  Clearer skin.  Better breathing.  Fewer sick days. Quitting smoking can be hard. Do not give up if you fail the first time. Some people need to try a few times before they succeed. Do your best to stick to your quit plan, and talk with your doctor if you have any questions or concerns. Summary  Smoking tobacco is the leading cause of preventable death. Quitting smoking can be hard, but it is one of  the best things that you can do for your health.  When you decide to quit smoking, make a plan to help you succeed.  Quit smoking right away, not slowly over a period of time.  When you start quitting, seek help from your doctor, family, or friends. This information is not intended to replace advice given to you by your health care provider. Make sure you discuss any questions you have with your health care provider. Document Revised: 11/16/2018 Document Reviewed: 05/12/2018 Elsevier Patient Education  [place].  Low-Sodium Eating Plan Sodium, which is an element that makes up salt, helps you maintain a healthy balance of fluids in your body. Too much sodium can increase your blood pressure and cause fluid and waste to be held in your body. Your health care provider or dietitian may recommend following this plan if you have high blood pressure (hypertension), kidney disease, liver disease, or heart failure. Eating less sodium can help lower your blood pressure, reduce swelling, and protect your heart, liver, and kidneys. What are tips for  following this plan? General guidelines  Most people on this plan should limit their sodium intake to 1,500-2,000 mg (milligrams) of sodium each day. Reading food labels   The Nutrition Facts label lists the amount of sodium in one serving of the food. If you eat more than one serving, you must multiply the listed amount of sodium by the number of servings.  Choose foods with less than 140 mg of sodium per serving.  Avoid foods with 300 mg of sodium or more per serving. Shopping  Look for lower-sodium products, often labeled as "low-sodium" or "no salt added."  Always check the sodium content even if foods are labeled as "unsalted" or "no salt added".  Buy fresh foods. ? Avoid canned foods and premade or frozen meals. ? Avoid canned, cured, or processed meats  Buy breads that have less than 80 mg of sodium per slice. Cooking  Eat more home-cooked food and less restaurant, buffet, and fast food.  Avoid adding salt when cooking. Use salt-free seasonings or herbs instead of table salt or sea salt. Check with your health care provider or pharmacist before using salt substitutes.  Cook with plant-based oils, such as canola, sunflower, or olive oil. Meal planning  When eating at a restaurant, ask that your food be prepared with less salt or no salt, if possible.  Avoid foods that contain MSG (monosodium glutamate). MSG is sometimes added to [place] food, bouillon, and some canned foods. What foods are recommended? The items listed may not be a complete list. Talk with your dietitian about what dietary choices are best for you. Grains Low-sodium cereals, including oats, puffed wheat and rice, and shredded wheat. Low-sodium crackers. Unsalted rice. Unsalted pasta. Low-sodium bread. Whole-grain breads and whole-grain pasta. Vegetables Fresh or frozen vegetables. "No salt added" canned vegetables. "No salt added" tomato sauce and paste. Low-sodium or reduced-sodium tomato and vegetable  juice. Fruits Fresh, frozen, or canned fruit. Fruit juice. Meats and other protein foods Fresh or frozen (no salt added) meat, poultry, seafood, and fish. Low-sodium canned tuna and salmon. Unsalted nuts. Dried peas, beans, and lentils without added salt. Unsalted canned beans. Eggs. Unsalted nut butters. Dairy Milk. Soy milk. Cheese that is naturally low in sodium, such as ricotta cheese, fresh mozzarella, or Swiss cheese Low-sodium or reduced-sodium cheese. Cream cheese. Yogurt. Fats and oils Unsalted butter. Unsalted margarine with no trans fat. Vegetable oils such as canola or olive oils. Seasonings and  other foods Fresh and dried herbs and spices. Salt-free seasonings. Low-sodium mustard and ketchup. Sodium-free salad dressing. Sodium-free light mayonnaise. Fresh or refrigerated horseradish. Lemon juice. Vinegar. Homemade, reduced-sodium, or low-sodium soups. Unsalted popcorn and pretzels. Low-salt or salt-free chips. What foods are not recommended? The items listed may not be a complete list. Talk with your dietitian about what dietary choices are best for you. Grains Instant hot cereals. Bread stuffing, pancake, and biscuit mixes. Croutons. Seasoned rice or pasta mixes. Noodle soup cups. Boxed or frozen macaroni and cheese. Regular salted crackers. Self-rising flour. Vegetables Sauerkraut, pickled vegetables, and relishes. Olives. [place] fries. Onion rings. Regular canned vegetables (not low-sodium or reduced-sodium). Regular canned tomato sauce and paste (not low-sodium or reduced-sodium). Regular tomato and vegetable juice (not low-sodium or reduced-sodium). Frozen vegetables in sauces. Meats and other protein foods Meat or fish that is salted, canned, smoked, spiced, or pickled. Bacon, ham, sausage, hotdogs, corned beef, chipped beef, packaged lunch meats, salt pork, jerky, pickled herring, anchovies, regular canned tuna, sardines, salted nuts. Dairy Processed cheese and cheese spreads.  Cheese curds. Blue cheese. Feta cheese. String cheese. Regular cottage cheese. Buttermilk. Canned milk. Fats and oils Salted butter. Regular margarine. Ghee. Bacon fat. Seasonings and other foods Onion salt, garlic salt, seasoned salt, table salt, and sea salt. Canned and packaged gravies. Worcestershire sauce. Tartar sauce. Barbecue sauce. Teriyaki sauce. Soy sauce, including reduced-sodium. Steak sauce. Fish sauce. Oyster sauce. Cocktail sauce. Horseradish that you find on the shelf. Regular ketchup and mustard. Meat flavorings and tenderizers. Bouillon cubes. Hot sauce and Tabasco sauce. Premade or packaged marinades. Premade or packaged taco seasonings. Relishes. Regular salad dressings. Salsa. Potato and tortilla chips. Corn chips and puffs. Salted popcorn and pretzels. Canned or dried soups. Pizza. Frozen entrees and pot pies. Summary  Eating less sodium can help lower your blood pressure, reduce swelling, and protect your heart, liver, and kidneys.  Most people on this plan should limit their sodium intake to 1,500-2,000 mg (milligrams) of sodium each day.  Canned, boxed, and frozen foods are high in sodium. Restaurant foods, fast foods, and pizza are also very high in sodium. You also get sodium by adding salt to food.  Try to cook at home, eat more fresh fruits and vegetables, and eat less fast food, canned, processed, or prepared foods. This information is not intended to replace advice given to you by your health care provider. Make sure you discuss any questions you have with your health care provider. Document Revised: 02/03/2017 Document Reviewed: 02/15/2016 Elsevier Patient Education  2020 [employer].

## 2019-06-28 NOTE — Telephone Encounter (Signed)
One month follow up appointment has been scheduled.   S/w patient's girlfriend, ok per DPR, and patient. They verbalized understanding to increase amlodipine to 10 mg once a day. Rx has been sent to pharmacy.  AVS mailed to patient.

## 2019-07-03 ENCOUNTER — Ambulatory Visit: Payer: Medicaid Other | Admitting: Surgery

## 2019-07-08 ENCOUNTER — Ambulatory Visit: Payer: Medicaid Other | Admitting: Surgery

## 2019-07-31 ENCOUNTER — Ambulatory Visit (INDEPENDENT_AMBULATORY_CARE_PROVIDER_SITE_OTHER): Payer: Medicaid Other | Admitting: Internal Medicine

## 2019-07-31 ENCOUNTER — Encounter: Payer: Self-pay | Admitting: Internal Medicine

## 2019-07-31 ENCOUNTER — Other Ambulatory Visit: Payer: Self-pay

## 2019-07-31 VITALS — BP 170/100 | HR 88 | Ht 73.0 in | Wt 151.1 lb

## 2019-07-31 DIAGNOSIS — I10 Essential (primary) hypertension: Secondary | ICD-10-CM

## 2019-07-31 NOTE — Progress Notes (Signed)
Follow-up Outpatient Visit Date: 07/31/2019  Primary Care Provider: Patient, No Pcp Per No address on file  Chief Complaint: Follow-up hypertension  HPI:  Shane Dominguez is a 59 y.o. male with history of hypertension, hepatitis C, and GERD, who presents for follow-up of hypertension and chest pain.  We last spoke via virtual visit a month ago, at which time Shane Dominguez was feeling well.  Due to persistently elevated blood pressure, we agreed to increase amlodipine to 10 mg daily.  We agreed to defer additional ischemia testing, given resolution of his chest pain.  Today, Shane Dominguez reports that he is feeling well without chest pain, shortness of breath, palpitations, lightheadedness, or edema.  Since our last visit, he has stopped taking all medications, including amlodipine, as he does not like taking medications.  He denies any side effects while on the meds.  He has not been checking his blood pressure at home.  --------------------------------------------------------------------------------------------------  Cardiovascular History & Procedures: Cardiovascular Problems:  Atypical chest pain  Risk Factors:  Hypertension, male gender, tobacco use, and age greater than 73  Cath/PCI:  None  CV Surgery:  None  EP Procedures and Devices:  None  Non-Invasive Evaluation(s):  Pharmacologic myocardial perfusion stress test (12/20/2017): Abnormal, probably low risk myocardial perfusion stress test. There is a small in size, severe, fixed basal and mid inferoseptal defect consistent with scar but cannot rule out artifact. No significant ischemia is identified. LVEF greater than 65% with normal wall motion. Significant extracardiac activity and diaphragmatic attenuation noted.  Transthoracic echocardiogram (12/20/2017): Normal LV size and wall thickness. LVEF 60-65% with normal wall motion and diastolic function. Mild mitral regurgitation. Normal RV size and  function.  Recent CV Pertinent Labs: Lab Results  Component Value Date   CHOL 131 12/20/2017   HDL 35 (L) 12/20/2017   LDLCALC 85 12/20/2017   TRIG 57 12/20/2017   CHOLHDL 3.7 12/20/2017   INR 1.16 08/18/2016   K 4.5 06/19/2019   K 3.5 07/18/2013   BUN 15 06/19/2019   BUN 10 07/18/2013   CREATININE 0.66 06/19/2019   CREATININE 0.84 07/18/2013    Past medical and surgical history were reviewed and updated in EPIC.  Current Meds  Medication Sig  . Garlic 10 MG CAPS Take 1 capsule by mouth daily.     Allergies: Patient has no known allergies.  Social History   Tobacco Use  . Smoking status: Current Every Day Smoker    Packs/day: 1.50    Years: 40.00    Pack years: 60.00    Types: Cigarettes    Last attempt to quit: 02/16/2018    Years since quitting: 1.4  . Smokeless tobacco: Never Used  Substance Use Topics  . Alcohol use: Not Currently  . Drug use: No    Family History  Problem Relation Age of Onset  . Stroke Mother   . COPD Father   . Emphysema Father   . Stroke Brother     Review of Systems: A 12-system review of systems was performed and was negative except as noted in the HPI.  --------------------------------------------------------------------------------------------------  Physical Exam: BP (!) 170/100 (BP Location: Left Arm, Patient Position: Sitting, Cuff Size: Normal)   Pulse 88   Ht 6\' 1"  (1.854 m)   Wt 151 lb 2 oz (68.5 kg)   SpO2 98%   BMI 19.94 kg/m   General:  NAD. Neck: No JVD or HJR. Lungs: CTA bilaterally. Heart: RRR w/o m/r/g. Abd: Soft, NT/ND. Ext: No LE edema.  2+ radial pulses.  EKG:  NSR with left axis deviation and left atrial enlargement.  Lab Results  Component Value Date   WBC 9.5 06/19/2019   HGB 16.0 06/19/2019   HCT 45.4 06/19/2019   MCV 96.2 06/19/2019   PLT 250 06/19/2019    Lab Results  Component Value Date   NA 136 06/19/2019   K 4.5 06/19/2019   CL 99 06/19/2019   CO2 30 06/19/2019   BUN 15  06/19/2019   CREATININE 0.66 06/19/2019   GLUCOSE 131 (H) 06/19/2019   ALT 22 08/18/2016    Lab Results  Component Value Date   CHOL 131 12/20/2017   HDL 35 (L) 12/20/2017   LDLCALC 85 12/20/2017   TRIG 57 12/20/2017   CHOLHDL 3.7 12/20/2017    --------------------------------------------------------------------------------------------------  ASSESSMENT AND PLAN: Hypertension: Blood pressure remains poorly controlled, with Shane Dominguez currently off all pharmacotherapy.  I have recommended that we restart amlodipine and consider addition of another agent, which he declines.  I have counseled him on sodium restriction, which he plans to follow.  I encouraged him to reconsider medications if we cannot get his blood pressure below 140/90 (unlikely to achieve target with lifestyle modifications alone).  Follow-up: Return to clinic in 3 months.  [name], MD 08/02/2019 7:56 PM

## 2019-07-31 NOTE — Patient Instructions (Signed)
Other Instructions Please check your blood pressure and heart rate at home twice a week for the next month. Write them down and call us to let us know the BP and heart rate readings.   Medication Instructions:  Your physician recommends that you continue on your current medications as directed. Please refer to the Current Medication list given to you today.  *If you need a refill on your cardiac medications before your next appointment, please call your pharmacy*   Follow-Up: At [facility], you and your health needs are our priority.  As part of our continuing mission to provide you with exceptional heart care, we have created designated Provider Care Teams.  These Care Teams include your primary Cardiologist (physician) and Advanced Practice Providers (APPs -  Physician Assistants and Nurse Practitioners) who all work together to provide you with the care you need, when you need it.  We recommend signing up for the patient portal called "MyChart".  Sign up information is provided on this After Visit Summary.  MyChart is used to connect with patients for Virtual Visits (Telemedicine).  Patients are able to view lab/test results, encounter notes, upcoming appointments, etc.  Non-urgent messages can be sent to your provider as well.   To learn more about what you can do with MyChart, go to [email or web address].    Your next appointment:   3 month(s)  The format for your next appointment:   In Person  Provider:    You may see DR Cristal Deer END or one of the following Advanced Practice Providers on your designated Care Team:    Nicolasa Ducking, NP  Eula Listen, PA-C  Marisue Ivan, PA-C

## 2019-08-02 ENCOUNTER — Encounter: Payer: Self-pay | Admitting: Internal Medicine

## 2019-10-30 ENCOUNTER — Ambulatory Visit: Payer: Medicaid Other | Admitting: Internal Medicine

## 2019-11-01 ENCOUNTER — Other Ambulatory Visit: Payer: Self-pay

## 2019-11-01 ENCOUNTER — Encounter: Payer: Self-pay | Admitting: Internal Medicine

## 2019-11-01 ENCOUNTER — Ambulatory Visit (INDEPENDENT_AMBULATORY_CARE_PROVIDER_SITE_OTHER): Payer: Medicaid Other | Admitting: Internal Medicine

## 2019-11-01 VITALS — BP 170/96 | HR 87 | Ht 73.0 in | Wt 149.0 lb

## 2019-11-01 DIAGNOSIS — I10 Essential (primary) hypertension: Secondary | ICD-10-CM

## 2019-11-01 DIAGNOSIS — Z720 Tobacco use: Secondary | ICD-10-CM | POA: Diagnosis not present

## 2019-11-01 MED ORDER — AMLODIPINE BESYLATE 5 MG PO TABS: 5.0000 mg | ORAL_TABLET | Freq: Every day | ORAL | 2 refills | Status: DC

## 2019-11-01 NOTE — Patient Instructions (Signed)
Medication Instructions:  Your physician has recommended you make the following change in your medication:  1- START Amlodipine 5 mg by mouth once a day.  *If you need a refill on your cardiac medications before your next appointment, please call your pharmacy*  Follow-Up: At [facility], you and your health needs are our priority.  As part of our continuing mission to provide you with exceptional heart care, we have created designated Provider Care Teams.  These Care Teams include your primary Cardiologist (physician) and Advanced Practice Providers (APPs -  Physician Assistants and Nurse Practitioners) who all work together to provide you with the care you need, when you need it.  We recommend signing up for the patient portal called "MyChart".  Sign up information is provided on this After Visit Summary.  MyChart is used to connect with patients for Virtual Visits (Telemedicine).  Patients are able to view lab/test results, encounter notes, upcoming appointments, etc.  Non-urgent messages can be sent to your provider as well.   To learn more about what you can do with MyChart, go to [email or web address].    Your next appointment:   3 month(s)  The format for your next appointment:   In Person  Provider:    You may see DR Cristal Deer END or one of the following Advanced Practice Providers on your designated Care Team:    Nicolasa Ducking, NP  Eula Listen, PA-C  Marisue Ivan, PA-C

## 2019-11-01 NOTE — Progress Notes (Signed)
Follow-up Outpatient Visit Date: 11/01/2019  Primary Care Provider: Patient, No Pcp Per No address on file  Chief Complaint: Follow-up hypertension  HPI:  Shane Dominguez is a 59 y.o. male with history of HTN, HCV, and GERD, who presents for follow-up of chest pain and hypertension.  I last saw him in May, at which time Shane Dominguez is doing well without chest pain and shortness of breath.  He had stopped taking all of his medication, as he does not like taking medicines.  He was unable to express any specific intolerance.  Blood pressure was quite elevated at that time (170/100).  We discussed the importance of lifestyle modification and pharmacotherapy in the management of hypertension, as well as the long-term risks associated with untreated hypertension.  Shane Dominguez declined reinitiation of pharmacotherapy.  Today, Shane Dominguez reports that he has been under quite a bit of stress.  His significant other's daughter passed away unexpectedly at age 39 two weeks ago.  He began smoking again at that time but is trying to quit.  He has otherwise felt well, denying chest pain, shortness of breath, palpitations, lightheadedness, and edema.  He endorses dietary indiscretion, particularly over the last month, as he has been eating out quite a bit.  He has not been checking his blood pressure at home.  --------------------------------------------------------------------------------------------------  Cardiovascular History & Procedures: Cardiovascular Problems:  Atypical chest pain  Risk Factors:  Hypertension, male gender, tobacco use, and age greater than 63  Cath/PCI:  None  CV Surgery:  None  EP Procedures and Devices:  None  Non-Invasive Evaluation(s):  Pharmacologic myocardial perfusion stress test (12/20/2017): Abnormal, probably low risk myocardial perfusion stress test. There is a small in size, severe, fixed basal and mid inferoseptal defect consistent with scar but  cannot rule out artifact. No significant ischemia is identified. LVEF greater than 65% with normal wall motion. Significant extracardiac activity and diaphragmatic attenuation noted.  Transthoracic echocardiogram (12/20/2017): Normal LV size and wall thickness. LVEF 60-65% with normal wall motion and diastolic function. Mild mitral regurgitation. Normal RV size and function.  Recent CV Pertinent Labs: Lab Results  Component Value Date   CHOL 131 12/20/2017   HDL 35 (L) 12/20/2017   LDLCALC 85 12/20/2017   TRIG 57 12/20/2017   CHOLHDL 3.7 12/20/2017   INR 1.16 08/18/2016   K 4.5 06/19/2019   K 3.5 07/18/2013   BUN 15 06/19/2019   BUN 10 07/18/2013   CREATININE 0.66 06/19/2019   CREATININE 0.84 07/18/2013    Past medical and surgical history were reviewed and updated in EPIC.  Medications: None  Allergies: Patient has no known allergies.  Social History   Tobacco Use  . Smoking status: Current Every Day Smoker    Packs/day: 1.50    Years: 40.00    Pack years: 60.00    Types: Cigarettes    Last attempt to quit: 02/16/2018    Years since quitting: 1.7  . Smokeless tobacco: Never Used  Vaping Use  . Vaping Use: Never used  Substance Use Topics  . Alcohol use: Not Currently  . Drug use: No    Family History  Problem Relation Age of Onset  . Stroke Mother   . COPD Father   . Emphysema Father   . Stroke Brother     Review of Systems: A 12-system review of systems was performed and was negative except as noted in the HPI.  --------------------------------------------------------------------------------------------------  Physical Exam: BP (!) 170/96 (BP Location: Left Arm, Patient Position:  Sitting, Cuff Size: Normal)   Pulse 87   Ht 6\' 1"  (1.854 m)   Wt 149 lb (67.6 kg)   BMI 19.66 kg/m   General: NAD. HEENT: No conjunctival pallor or scleral icterus. Facemask in place. Neck: Supple without lymphadenopathy, thyromegaly, JVD, or HJR. Lungs: Normal  work of breathing. Clear to auscultation bilaterally without wheezes or crackles. Heart: Regular rate and rhythm without murmurs, rubs, or gallops. Abd: Bowel sounds present. Soft, NT/ND. Ext: No lower extremity edema.  EKG: Normal sinus rhythm with left axis deviation.  Otherwise, no significant abnormality.  Lab Results  Component Value Date   WBC 9.5 06/19/2019   HGB 16.0 06/19/2019   HCT 45.4 06/19/2019   MCV 96.2 06/19/2019   PLT 250 06/19/2019    Lab Results  Component Value Date   NA 136 06/19/2019   K 4.5 06/19/2019   CL 99 06/19/2019   CO2 30 06/19/2019   BUN 15 06/19/2019   CREATININE 0.66 06/19/2019   GLUCOSE 131 (H) 06/19/2019   ALT 22 08/18/2016    Lab Results  Component Value Date   CHOL 131 12/20/2017   HDL 35 (L) 12/20/2017   LDLCALC 85 12/20/2017   TRIG 57 12/20/2017   CHOLHDL 3.7 12/20/2017    --------------------------------------------------------------------------------------------------  ASSESSMENT AND PLAN: Uncontrolled hypertension: Shane Dominguez continues to have significantly elevated blood pressure.  He has tried lifestyle modifications in the past but continues to struggle with dietary indiscretion and increased sodium intake.  I have reiterated the importance of pharmacotherapy in addition to sodium restriction, as I doubt that lifestyle modifications alone will help Shane Dominguez achieve a target blood pressure of less than 140/90.  He is agreeable to reinitiation of amlodipine; we will start 5 mg daily and plan to reassess his progress in 3 months.  We also spoke again at length regarding the importance of sodium restriction and strategies to achieve this.  Tobacco use: Unfortunately, Shane Dominguez has started smoking again since the unexpected death of his significant other's daughter earlier this month.  He is hopeful about quitting again, and I have encouraged him to do so.  Follow-up: Return to clinic in 3 months.  Kem Parkinson,  MD 11/02/2019 6:56 PM

## 2019-11-02 ENCOUNTER — Encounter: Payer: Self-pay | Admitting: Internal Medicine

## 2019-11-02 DIAGNOSIS — Z87891 Personal history of nicotine dependence: Secondary | ICD-10-CM | POA: Insufficient documentation

## 2019-11-02 DIAGNOSIS — Z720 Tobacco use: Secondary | ICD-10-CM | POA: Insufficient documentation

## 2019-11-20 ENCOUNTER — Encounter: Payer: Self-pay | Admitting: Podiatry

## 2019-11-20 ENCOUNTER — Ambulatory Visit: Payer: Medicaid Other | Admitting: Podiatry

## 2019-11-20 ENCOUNTER — Other Ambulatory Visit: Payer: Self-pay

## 2019-11-20 DIAGNOSIS — L603 Nail dystrophy: Secondary | ICD-10-CM

## 2019-11-20 NOTE — Progress Notes (Signed)
Patient left without being seen today.

## 2020-02-05 ENCOUNTER — Ambulatory Visit: Payer: Medicaid Other | Admitting: Internal Medicine

## 2020-02-07 ENCOUNTER — Encounter: Payer: Self-pay | Admitting: Nurse Practitioner

## 2020-02-07 ENCOUNTER — Ambulatory Visit (INDEPENDENT_AMBULATORY_CARE_PROVIDER_SITE_OTHER): Payer: Medicaid Other | Admitting: Nurse Practitioner

## 2020-02-07 VITALS — BP 144/88 | HR 88 | Ht 73.0 in | Wt 147.0 lb

## 2020-02-07 DIAGNOSIS — I10 Essential (primary) hypertension: Secondary | ICD-10-CM | POA: Diagnosis not present

## 2020-02-07 DIAGNOSIS — Z720 Tobacco use: Secondary | ICD-10-CM

## 2020-02-07 MED ORDER — AMLODIPINE BESYLATE 10 MG PO TABS: 10.0000 mg | ORAL_TABLET | Freq: Every day | ORAL | 2 refills | Status: DC

## 2020-02-07 NOTE — Progress Notes (Signed)
Office Visit    Patient Name: Shane Dominguez Date of Encounter: 02/07/2020  Primary Care Provider:  Patient, No Pcp Per Primary Cardiologist:  [name], MD  Chief Complaint    59 year-old male with a history of hypertension, hepatitis C, tobacco abuse, and GERD, who presents for follow-up related to hypertension.   Past Medical History    Past Medical History:  Diagnosis Date  . Acute appendicitis   . Appendicitis 08/18/2016  . GERD (gastroesophageal reflux disease)    NO MEDS  . Hepatitis C   . Hypertension   . Tobacco abuse    Past Surgical History:  Procedure Laterality Date  . HERNIA REPAIR  11/18/2016  . INGUINAL HERNIA REPAIR Bilateral 11/17/2016   Procedure: LAPAROSCOPIC BILATERAL INGUINAL HERNIA REPAIR;  Surgeon: Leafy Ro, MD;  Location: [facility] ORS;  Service: General;  Laterality: Bilateral;  . INSERTION OF MESH Bilateral 11/17/2016   Procedure: INSERTION OF MESH;  Surgeon: Leafy Ro, MD;  Location: [facility] ORS;  Service: General;  Laterality: Bilateral;  . LAPAROSCOPIC APPENDECTOMY N/A 08/18/2016   Procedure: APPENDECTOMY LAPAROSCOPIC;  Surgeon: Leafy Ro, MD;  Location: [facility] ORS;  Service: General;  Laterality: N/A;    Allergies  No Known Allergies  History of Present Illness    59 year-old male with the above past medical history including hypertension, hepatitis C, tobacco abuse, and GERD.  He has been seen several times in clinic over the past two years for uncontrolled hypertension and atypical chest pain, which improved with the addition of amlodipine. He underwent stress testing in October 2019, which was low risk and showed a normal EF. Further ischemic evaluation was deferred as his chest pain was thought to be likely related to GERD. His blood pressure remained uncontrolled in the setting of non-adherence and excessive sodium intake. He was last seen in clinic on 11/01/2019 for uncontrolled hypertension. He agreed to restart his  amlodipine, reduce his sodium intake, and follow up in 3 months. Smoking cessation was advised.   Since his last visit he is doing much better, though his blood pressure remains above goal. He does report ongoing personal stress related to the unexpected death of this significant other's daughter. He is taking his amlodipine as prescribed, however, he is not checking his blood pressure at home and his sodium intake remains high. He denies chest pain, palpitations, dyspnea, pnd, orthopnea, n, v, dizziness, syncope, edema, weight gain, or early satiety.   Home Medications    Prior to Admission medications   Medication Sig Start Date End Date Taking? Authorizing Provider  amLODipine (NORVASC) 5 MG tablet Take 1 tablet (5 mg total) by mouth daily. 11/01/19 01/30/20  End, Cristal Deer, MD  gabapentin (NEURONTIN) 300 MG capsule Take 1 capsule (300 mg total) by mouth 3 (three) times daily. Patient not taking: Reported on 07/31/2019 06/12/19   Sterling Big F, MD  Garlic 10 MG CAPS Take 1 capsule by mouth daily.  Patient not taking: Reported on 11/01/2019    [provider]  loratadine (CLARITIN) 10 MG tablet Take 10 mg by mouth daily. Patient not taking: Reported on 11/01/2019    [provider]  Multiple Vitamin (MULTIVITAMIN) tablet Take 1 tablet by mouth daily. Patient not taking: Reported on 11/01/2019    [provider]  naproxen (NAPROSYN) 500 MG tablet Take 1 tablet (500 mg total) by mouth 2 (two) times daily with a meal. Patient not taking: Reported on 07/31/2019 06/12/19 06/11/20  [place], [place]  F, MD  omeprazole (PRILOSEC OTC) 20 MG tablet Take 1 tablet (20 mg total) by mouth daily. Patient not taking: Reported on 07/31/2019 06/20/19 06/19/20  Loleta Rose, MD    Review of Systems    He denies chest pain, palpitations, dyspnea, pnd, orthopnea, n, v, dizziness, syncope, edema, weight gain, or early satiety.  All other systems reviewed and are otherwise negative except as noted  above.  Physical Exam    VS:  BP (!) 144/88 (BP Location: Left Arm, Patient Position: Sitting, Cuff Size: Normal)   Pulse 88   Ht 6\' 1"  (1.854 m)   Wt 147 lb (66.7 kg)   SpO2 98%   BMI 19.39 kg/m   GEN: Well nourished, well developed, in no acute distress. HEENT: normal. Neck: Supple, no JVD, carotid bruits, or masses. Cardiac: RRR, no murmurs, rubs, or gallops. No clubbing, cyanosis, edema.  PT 2+ and equal bilaterally.  Respiratory:  Respirations regular and unlabored, lung sounds diminished at bases bilaterally. GI: Soft, nontender, nondistended, BS + x 4. MS: no deformity or atrophy. Skin: warm and dry, no rash. Neuro:  Strength and sensation are intact. Psych: Normal affect.  Accessory Clinical Findings   Lab Results  Component Value Date   WBC 9.5 06/19/2019   HGB 16.0 06/19/2019   HCT 45.4 06/19/2019   MCV 96.2 06/19/2019   PLT 250 06/19/2019   Lab Results  Component Value Date   CREATININE 0.66 06/19/2019   BUN 15 06/19/2019   NA 136 06/19/2019   K 4.5 06/19/2019   CL 99 06/19/2019   CO2 30 06/19/2019   Lab Results  Component Value Date   ALT 22 08/18/2016   AST 28 08/18/2016   ALKPHOS 53 08/18/2016   BILITOT 1.1 08/18/2016   Lab Results  Component Value Date   CHOL 131 12/20/2017   HDL 35 (L) 12/20/2017   LDLCALC 85 12/20/2017   TRIG 57 12/20/2017   CHOLHDL 3.7 12/20/2017    Lab Results  Component Value Date   HGBA1C 5.7 05/01/2013    Assessment & Plan    1.  Essential hypertension: Long history of uncontrolled hypertension and non-adherence. Since his visit in August 2021 he has been taking amlodipine 5 mg daily. His blood pressure is improved today, 144/88, but remains above goal.  He is not checking his blood pressure at home and his sodium intake remains high. Will increase amlodipine to 10 mg daily. Follow-up in clinic in 3 months.   2. Tobacco abuse: He is still smoking 1-2 cigarette a day. Advised full cessation.   3. Disposition:  Increase amlodipine to 10 mg daily. Follow-up in clinic in 3 months.   September 2021, NP 02/07/2020, 5:23 PM

## 2020-02-07 NOTE — Patient Instructions (Signed)
Medication Instructions:  INCREASE amlodipine to 10 mg daily  *If you need a refill on your cardiac medications before your next appointment, please call your pharmacy*   Lab Work: None ordered  If you have labs (blood work) drawn today and your tests are completely normal, you will receive your results only by: Marland Kitchen MyChart Message (if you have MyChart) OR . A paper copy in the mail If you have any lab test that is abnormal or we need to change your treatment, we will call you to review the results.   Testing/Procedures: None ordered   Follow-Up: At [facility], you and your health needs are our priority.  As part of our continuing mission to provide you with exceptional heart care, we have created designated Provider Care Teams.  These Care Teams include your primary Cardiologist (physician) and Advanced Practice Providers (APPs -  Physician Assistants and Nurse Practitioners) who all work together to provide you with the care you need, when you need it.  We recommend signing up for the patient portal called "MyChart".  Sign up information is provided on this After Visit Summary.  MyChart is used to connect with patients for Virtual Visits (Telemedicine).  Patients are able to view lab/test results, encounter notes, upcoming appointments, etc.  Non-urgent messages can be sent to your provider as well.   To learn more about what you can do with MyChart, go to [email or web address].    Your next appointment:   3 month(s)  The format for your next appointment:   In Person  Provider:   You may see [name], MD or one of the following Advanced Practice Providers on your designated Care Team:    Nicolasa Ducking, NP  Eula Listen, PA-C  Marisue Ivan, PA-C  Cadence Fries, [place]  Gillian Shields, NP

## 2020-02-17 ENCOUNTER — Telehealth: Payer: Self-pay | Admitting: Internal Medicine

## 2020-02-17 NOTE — Telephone Encounter (Signed)
Spoke to patient's partner. She says patient does not want to increase the amlodipine. She says when they got to the appointment the other day, patient was rushing/running to get there and that's why his BP was up to 144/88. Reports the BP was retaken and was about 130/84 like her's was at her visit with Dr End the same day; however, I cannot see the second reading. She says she wished he had seen Dr End and not the "other guy" for his visit. I reassured her the the NP worked with Dr End and that Dr End trusted his judgment. Patient's partner was adamant that patient did not need his amlodipine increased. I asked if they had any home BP readings to share. She then seemed to get upset and said "I cannot functions right now, I'm going to a mental evaluation because my daughter died." I expressed my concern. She is a patient of Dr Serita Kyle as well. She did not have any BP readings.   She still asked for me to send in amlodipine 5 mg refill for patient instead as that's what patient wanted to stay at until he saw Dr End in March. Patient then got on the phone and said that's what he wanted to do.  I encouraged them to take BP/HR readings once a day for a week or so to see what the trend is. The partner then said she was overwhelmed and the patient just needed the amlodipine 5 mg prescription sent into the pharmacy. Advised I will route to Dr End for review.

## 2020-02-17 NOTE — Telephone Encounter (Signed)
Please let Mr. Shane Dominguez and his partner know that I agree with Gilford Raid recommendation to increase amlodipine.  While he may have been rushing to get to his appointment last week, it should be noted that he has not had a BP reading below 140/90 at any of his office visits  in over 2 years.  He should have a blood pressure consistently below 140/90 (ideally less than 130/80) to prevent adverse consequences of uncontrolled hypertension (e.g. stroke, heart attack, heart failure, kidney failure, and blindness).  [name], MD [facility]

## 2020-02-17 NOTE — Telephone Encounter (Signed)
Patient's domestic partner is calling to get a "second opinion" on patient's  Blood pressure. She states the whomever patient saw at his last visit "jacked the mg up" . She does not think patient should be taking 10 mg of Amlodipine.

## 2020-02-18 NOTE — Telephone Encounter (Signed)
Called patient and read him Dr Serita Kyle recommendations. He verbalized understanding and agreed to take the amlodipine 10 mg daily. He then gave the phone to his partner, Shane Dominguez, and she verbalized understanding.

## 2020-05-29 ENCOUNTER — Ambulatory Visit: Payer: Medicaid Other | Admitting: Internal Medicine

## 2020-05-29 NOTE — Progress Notes (Deleted)
Follow-up Outpatient Visit Date: 05/29/2020  Primary Care Provider: Patient, No Pcp Per No address on file  Chief Complaint: ***  HPI:  Shane Dominguez is a 60 y.o. male with history of hypertension, hepatitis C, and GERD, who presents for follow-up of hypertension.  He was last seen in our office on 02/07/2020 by Ward Givens, NP, at which time he was doing much better after restarting amlodipine in late August.  As his blood pressure remained mildly elevated, amlodipine was increased to 10 mg daily.  --------------------------------------------------------------------------------------------------  Cardiovascular History & Procedures: Cardiovascular Problems:  Atypical chest pain  Risk Factors:  Hypertension, male gender, tobacco use, and age greater than 78  Cath/PCI:  None  CV Surgery:  None  EP Procedures and Devices:  None  Non-Invasive Evaluation(s):  Pharmacologic myocardial perfusion stress test (12/20/2017): Abnormal, probably low risk myocardial perfusion stress test. There is a small in size, severe, fixed basal and mid inferoseptal defect consistent with scar but cannot rule out artifact. No significant ischemia is identified. LVEF greater than 65% with normal wall motion. Significant extracardiac activity and diaphragmatic attenuation noted.  Transthoracic echocardiogram (12/20/2017): Normal LV size and wall thickness. LVEF 60-65% with normal wall motion and diastolic function. Mild mitral regurgitation. Normal RV size and function.   Recent CV Pertinent Labs: Lab Results  Component Value Date   CHOL 131 12/20/2017   HDL 35 (L) 12/20/2017   LDLCALC 85 12/20/2017   TRIG 57 12/20/2017   CHOLHDL 3.7 12/20/2017   INR 1.16 08/18/2016   K 4.5 06/19/2019   K 3.5 07/18/2013   BUN 15 06/19/2019   BUN 10 07/18/2013   CREATININE 0.66 06/19/2019   CREATININE 0.84 07/18/2013    Past medical and surgical history were reviewed and updated in  EPIC.  No outpatient medications have been marked as taking for the 05/29/20 encounter (Appointment) with Martha Soltys, Shane Deer, MD.    Allergies: Patient has no known allergies.  Social History   Tobacco Use  . Smoking status: Current Every Day Smoker    Packs/day: 1.50    Years: 40.00    Pack years: 60.00    Types: Cigarettes    Last attempt to quit: 02/16/2018    Years since quitting: 2.2  . Smokeless tobacco: Never Used  Vaping Use  . Vaping Use: Never used  Substance Use Topics  . Alcohol use: Not Currently  . Drug use: No    Family History  Problem Relation Age of Onset  . Stroke Mother   . COPD Father   . Emphysema Father   . Stroke Brother     Review of Systems: A 12-system review of systems was performed and was negative except as noted in the HPI.  --------------------------------------------------------------------------------------------------  Physical Exam: There were no vitals taken for this visit.  General:  NAD. Neck: No JVD or HJR. Lungs: Clear to auscultation bilaterally without wheezes or crackles. Heart: Regular rate and rhythm without murmurs, rubs, or gallops. Abdomen: Soft, nontender, nondistended. Extremities: No lower extremity edema.  EKG:  ***  Lab Results  Component Value Date   WBC 9.5 06/19/2019   HGB 16.0 06/19/2019   HCT 45.4 06/19/2019   MCV 96.2 06/19/2019   PLT 250 06/19/2019    Lab Results  Component Value Date   NA 136 06/19/2019   K 4.5 06/19/2019   CL 99 06/19/2019   CO2 30 06/19/2019   BUN 15 06/19/2019   CREATININE 0.66 06/19/2019   GLUCOSE 131 (H) 06/19/2019  ALT 22 08/18/2016    Lab Results  Component Value Date   CHOL 131 12/20/2017   HDL 35 (L) 12/20/2017   LDLCALC 85 12/20/2017   TRIG 57 12/20/2017   CHOLHDL 3.7 12/20/2017    --------------------------------------------------------------------------------------------------  ASSESSMENT AND PLAN: Shane Deer Macsen Nuttall, MD 05/29/2020 7:53  AM

## 2020-06-01 ENCOUNTER — Encounter: Payer: Self-pay | Admitting: Internal Medicine

## 2020-07-06 ENCOUNTER — Other Ambulatory Visit: Payer: Self-pay

## 2020-07-06 MED ORDER — AMLODIPINE BESYLATE 10 MG PO TABS: 10.0000 mg | ORAL_TABLET | Freq: Every day | ORAL | 0 refills | Status: DC

## 2020-07-06 NOTE — Telephone Encounter (Signed)
Please schedule overdue 3 month F/U appointment. Patient did not show for last scheduled visit on 05/29/2020. Thank you!

## 2020-07-06 NOTE — Telephone Encounter (Signed)
Scheduled 5/4

## 2020-07-06 NOTE — Telephone Encounter (Signed)
*  STAT* If patient is at the pharmacy, call can be transferred to refill team.   1. Which medications need to be refilled? (please list name of each medication and dose if known) Amlodipine  2. Which pharmacy/location (including street and city if local pharmacy) is medication to be sent to? [facility] [place]  3. Do they need a 30 day or 90 day supply? 90

## 2020-07-06 NOTE — Telephone Encounter (Signed)
Attempted to schedule.  

## 2020-07-08 ENCOUNTER — Ambulatory Visit: Payer: Medicaid Other | Admitting: Internal Medicine

## 2020-07-08 NOTE — Progress Notes (Deleted)
Follow-up Outpatient Visit Date: 07/08/2020  Primary Care Provider: Patient, No Pcp Per (Inactive) No address on file  Chief Complaint: ***  HPI:  Mr. Brucato is a 60 y.o. male with history of HTN, HCV, and GERD, who presents for follow-up of uncontrolled hypertension.  He was last seen in our office in early 02/2020 by Ward Givens, NP, at which time he was doing relatively well though her blood pressures were still above goal.  Mildly elevated blood pressure was noticed in the office at that time.  Amlodipine was increased to 10 mg daily.  --------------------------------------------------------------------------------------------------  Cardiovascular History & Procedures: Cardiovascular Problems:  Atypical chest pain  Risk Factors:  Hypertension, male gender, tobacco use, and age greater than 4  Cath/PCI:  None  CV Surgery:  None  EP Procedures and Devices:  None  Non-Invasive Evaluation(s):  Pharmacologic myocardial perfusion stress test (12/20/2017): Abnormal, probably low risk myocardial perfusion stress test. There is a small in size, severe, fixed basal and mid inferoseptal defect consistent with scar but cannot rule out artifact. No significant ischemia is identified. LVEF greater than 65% with normal wall motion. Significant extracardiac activity and diaphragmatic attenuation noted.  Transthoracic echocardiogram (12/20/2017): Normal LV size and wall thickness. LVEF 60-65% with normal wall motion and diastolic function. Mild mitral regurgitation. Normal RV size and function.   Recent CV Pertinent Labs: Lab Results  Component Value Date   CHOL 131 12/20/2017   HDL 35 (L) 12/20/2017   LDLCALC 85 12/20/2017   TRIG 57 12/20/2017   CHOLHDL 3.7 12/20/2017   INR 1.16 08/18/2016   K 4.5 06/19/2019   K 3.5 07/18/2013   BUN 15 06/19/2019   BUN 10 07/18/2013   CREATININE 0.66 06/19/2019   CREATININE 0.84 07/18/2013    Past medical and surgical  history were reviewed and updated in EPIC.  No outpatient medications have been marked as taking for the 07/08/20 encounter (Appointment) with Kashvi Prevette, Cristal Deer, MD.    Allergies: Patient has no known allergies.  Social History   Tobacco Use  . Smoking status: Current Every Day Smoker    Packs/day: 1.50    Years: 40.00    Pack years: 60.00    Types: Cigarettes    Last attempt to quit: 02/16/2018    Years since quitting: 2.3  . Smokeless tobacco: Never Used  Vaping Use  . Vaping Use: Never used  Substance Use Topics  . Alcohol use: Not Currently  . Drug use: No    Family History  Problem Relation Age of Onset  . Stroke Mother   . COPD Father   . Emphysema Father   . Stroke Brother     Review of Systems: A 12-system review of systems was performed and was negative except as noted in the HPI.  --------------------------------------------------------------------------------------------------  Physical Exam: There were no vitals taken for this visit.  General:  NAD. Neck: No JVD or HJR. Lungs: Clear to auscultation bilaterally without wheezes or crackles. Heart: Regular rate and rhythm without murmurs, rubs, or gallops. Abdomen: Soft, nontender, nondistended. Extremities: No lower extremity edema.  EKG:  ***  Lab Results  Component Value Date   WBC 9.5 06/19/2019   HGB 16.0 06/19/2019   HCT 45.4 06/19/2019   MCV 96.2 06/19/2019   PLT 250 06/19/2019    Lab Results  Component Value Date   NA 136 06/19/2019   K 4.5 06/19/2019   CL 99 06/19/2019   CO2 30 06/19/2019   BUN 15 06/19/2019   CREATININE  0.66 06/19/2019   GLUCOSE 131 (H) 06/19/2019   ALT 22 08/18/2016    Lab Results  Component Value Date   CHOL 131 12/20/2017   HDL 35 (L) 12/20/2017   LDLCALC 85 12/20/2017   TRIG 57 12/20/2017   CHOLHDL 3.7 12/20/2017    --------------------------------------------------------------------------------------------------  ASSESSMENT AND  PLAN: Cristal Deer Eimi Viney, MD 07/08/2020 7:39 AM

## 2020-07-10 ENCOUNTER — Encounter: Payer: Self-pay | Admitting: Physician Assistant

## 2020-07-10 ENCOUNTER — Ambulatory Visit (INDEPENDENT_AMBULATORY_CARE_PROVIDER_SITE_OTHER): Payer: Medicaid Other | Admitting: Physician Assistant

## 2020-07-10 VITALS — BP 166/96 | HR 86 | Ht 73.0 in | Wt 149.0 lb

## 2020-07-10 DIAGNOSIS — Z720 Tobacco use: Secondary | ICD-10-CM

## 2020-07-10 DIAGNOSIS — I10 Essential (primary) hypertension: Secondary | ICD-10-CM | POA: Diagnosis not present

## 2020-07-10 NOTE — Patient Instructions (Addendum)
Medication Instructions:  Your physician recommends that you continue on your current medications as directed. Please refer to the Current Medication list given to you today.  *If you need a refill on your cardiac medications before your next appointment, please call your pharmacy*   Lab Work: None ordered   Testing/Procedures: None ordered   Follow-Up: At [facility], you and your health needs are our priority.  As part of our continuing mission to provide you with exceptional heart care, we have created designated Provider Care Teams.  These Care Teams include your primary Cardiologist (physician) and Advanced Practice Providers (APPs -  Physician Assistants and Nurse Practitioners) who all work together to provide you with the care you need, when you need it.  We recommend signing up for the patient portal called "MyChart".  Sign up information is provided on this After Visit Summary.  MyChart is used to connect with patients for Virtual Visits (Telemedicine).  Patients are able to view lab/test results, encounter notes, upcoming appointments, etc.  Non-urgent messages can be sent to your provider as well.   To learn more about what you can do with MyChart, go to [email or web address].    Your next appointment:   2 week(s)  The format for your next appointment:   In Person  Provider:   You may see [name], MD or one of the following Advanced Practice Providers on your designated Care Team:     Marisue Ivan, PA-C   Other Instructions  CLARITIN ALLERGY MEDICATION You reported allergies and asked that I type Claritin / Zyrtec in the AVS so he doesn't forget it.   REMEMBER TO BRING YOUR HOME BLOOD PRESSURE CUFF TO YOUR NEXT OFFICE VISIT, SO THAT WE ARE ABLE TO CHECK IT AGAINST OUR OFFICE CUFF.  REMEMBER TO TAKE YOUR BLOOD PRESSURE MEDICATION BEFORE YOUR OFFICE VISIT, SO THAT WE CAN MAKE MEDICATION CHANGES FOR YOU IF NEEDED.  Blood pressure: --We recommend  upper arm BP cuffs over that of the wrist. --You can always check your device's accuracy against an office model once a year if possible. You can do so by bringing your cuff into the office and notifying the person that rooms you that you would like to check your cuff against our office readings. --Measure your BP at the same time each day. Blood pressure varies often throughout the day with higher readings in the morning. BP may also be lower at home than in the office. Do not measure BP right after you wake up or after exercising. Avoid caffeine, tobacco, and alcohol for 30 minutes before taking a measurement.  --Sit quietly for five minutes in a comfortable position with your legs and ankles uncrossed and back supported. Feet flat on the ground. Have your arm supported and at the level of your heart. Always use the same arm when taking your blood pressure. Place the cuff over bare skin rather than clothing. Each time you measure, take an additional reading if abnormal to ensure accurate by waiting 1-3 minutes after the first reading. --Please bring a BP log into the office. It is helpful to document the time of each BP reading, as well as any activity or medications taken around the reading. In addition, it is helpful to include heart rate at the time of the BP reading. Daily weights are also encouraged. --Goal BP is 130/80 or lower. If your BP is low but you are not dizzy, this is fine and preferred over BP higher than 130/80. If your  BP is less than 100 for the top number and you are dizzy, call the office. If your blood pressure is consistently elevated with top number above 180 and bottom above 120, this can damage the body. If you have severe increase in your blood pressure or concerning symptoms of severe chest pain, headache with confusion and blurred vision, severe abdominal or back pain, shortness of breath, seizures, or loss of consciousness, go to the emergency department.     https://www.mata.com/https://www.nhlbi.nih.gov/files/docs/public/heart/dash_brief.pdf">  DASH Eating Plan DASH stands for Dietary Approaches to Stop Hypertension. The DASH eating plan is a healthy eating plan that has been shown to:  Reduce high blood pressure (hypertension).  Reduce your risk for type 2 diabetes, heart disease, and stroke.  Help with weight loss. What are tips for following this plan? Reading food labels  Check food labels for the amount of salt (sodium) per serving. Choose foods with less than 5 percent of the Daily Value of sodium. Generally, foods with less than 300 milligrams (mg) of sodium per serving fit into this eating plan.  To find whole grains, look for the word "whole" as the first word in the ingredient list. Shopping  Buy products labeled as "low-sodium" or "no salt added."  Buy fresh foods. Avoid canned foods and pre-made or frozen meals. Cooking  Avoid adding salt when cooking. Use salt-free seasonings or herbs instead of table salt or sea salt. Check with your health care provider or pharmacist before using salt substitutes.  Do not fry foods. Cook foods using healthy methods such as baking, boiling, grilling, roasting, and broiling instead.  Cook with heart-healthy oils, such as olive, canola, avocado, soybean, or sunflower oil. Meal planning  Eat a balanced diet that includes: ? 4 or more servings of fruits and 4 or more servings of vegetables each day. Try to fill one-half of your plate with fruits and vegetables. ? 6-8 servings of whole grains each day. ? Less than 6 oz (170 g) of lean meat, poultry, or fish each day. A 3-oz (85-g) serving of meat is about the same size as a deck of cards. One egg equals 1 oz (28 g). ? 2-3 servings of low-fat dairy each day. One serving is 1 cup (237 mL). ? 1 serving of nuts, seeds, or beans 5 times each week. ? 2-3 servings of heart-healthy fats. Healthy fats called omega-3 fatty acids are found in foods such as walnuts,  flaxseeds, fortified milks, and eggs. These fats are also found in cold-water fish, such as sardines, salmon, and mackerel.  Limit how much you eat of: ? Canned or prepackaged foods. ? Food that is high in trans fat, such as some fried foods. ? Food that is high in saturated fat, such as fatty meat. ? Desserts and other sweets, sugary drinks, and other foods with added sugar. ? Full-fat dairy products.  Do not salt foods before eating.  Do not eat more than 4 egg yolks a week.  Try to eat at least 2 vegetarian meals a week.  Eat more home-cooked food and less restaurant, buffet, and fast food.   Lifestyle  When eating at a restaurant, ask that your food be prepared with less salt or no salt, if possible.  If you drink alcohol: ? Limit how much you use to:  0-1 drink a day for women who are not pregnant.  0-2 drinks a day for men. ? Be aware of how much alcohol is in your drink. In the [place] one drink  equals one 12 oz bottle of beer (355 mL), one 5 oz glass of wine (148 mL), or one 1 oz glass of hard liquor (44 mL). General information  Avoid eating more than 2,300 mg of salt a day. If you have hypertension, you may need to reduce your sodium intake to 1,500 mg a day.  Work with your health care provider to maintain a healthy body weight or to lose weight. Ask what an ideal weight is for you.  Get at least 30 minutes of exercise that causes your heart to beat faster (aerobic exercise) most days of the week. Activities may include walking, swimming, or biking.  Work with your health care provider or dietitian to adjust your eating plan to your individual calorie needs. What foods should I eat? Fruits All fresh, dried, or frozen fruit. Canned fruit in natural juice (without added sugar). Vegetables Fresh or frozen vegetables (raw, steamed, roasted, or grilled). Low-sodium or reduced-sodium tomato and vegetable juice. Low-sodium or reduced-sodium tomato sauce and tomato paste.  Low-sodium or reduced-sodium canned vegetables. Grains Whole-grain or whole-wheat bread. Whole-grain or whole-wheat pasta. Brown rice. Orpah Cobb. Bulgur. Whole-grain and low-sodium cereals. Pita bread. Low-fat, low-sodium crackers. Whole-wheat flour tortillas. Meats and other proteins Skinless chicken or [place]. Ground chicken or [place]. Pork with fat trimmed off. Fish and seafood. Egg whites. Dried beans, peas, or lentils. Unsalted nuts, nut butters, and seeds. Unsalted canned beans. Lean cuts of beef with fat trimmed off. Low-sodium, lean precooked or cured meat, such as sausages or meat loaves. Dairy Low-fat (1%) or fat-free (skim) milk. Reduced-fat, low-fat, or fat-free cheeses. Nonfat, low-sodium ricotta or cottage cheese. Low-fat or nonfat yogurt. Low-fat, low-sodium cheese. Fats and oils Soft margarine without trans fats. Vegetable oil. Reduced-fat, low-fat, or light mayonnaise and salad dressings (reduced-sodium). Canola, safflower, olive, avocado, soybean, and sunflower oils. Avocado. Seasonings and condiments Herbs. Spices. Seasoning mixes without salt. Other foods Unsalted popcorn and pretzels. Fat-free sweets. The items listed above may not be a complete list of foods and beverages you can eat. Contact a dietitian for more information. What foods should I avoid? Fruits Canned fruit in a light or heavy syrup. Fried fruit. Fruit in cream or butter sauce. Vegetables Creamed or fried vegetables. Vegetables in a cheese sauce. Regular canned vegetables (not low-sodium or reduced-sodium). Regular canned tomato sauce and paste (not low-sodium or reduced-sodium). Regular tomato and vegetable juice (not low-sodium or reduced-sodium). Rosita Fire. Olives. Grains Baked goods made with fat, such as croissants, muffins, or some breads. Dry pasta or rice meal packs. Meats and other proteins Fatty cuts of meat. Ribs. Fried meat. Tomasa Blase. Bologna, salami, and other precooked or cured meats, such as  sausages or meat loaves. Fat from the back of a pig (fatback). Bratwurst. Salted nuts and seeds. Canned beans with added salt. Canned or smoked fish. Whole eggs or egg yolks. Chicken or [place] with skin. Dairy Whole or 2% milk, cream, and half-and-half. Whole or full-fat cream cheese. Whole-fat or sweetened yogurt. Full-fat cheese. Nondairy creamers. Whipped toppings. Processed cheese and cheese spreads. Fats and oils Butter. Stick margarine. Lard. Shortening. Ghee. Bacon fat. Tropical oils, such as coconut, palm kernel, or palm oil. Seasonings and condiments Onion salt, garlic salt, seasoned salt, table salt, and sea salt. Worcestershire sauce. Tartar sauce. Barbecue sauce. Teriyaki sauce. Soy sauce, including reduced-sodium. Steak sauce. Canned and packaged gravies. Fish sauce. Oyster sauce. Cocktail sauce. Store-bought horseradish. Ketchup. Mustard. Meat flavorings and tenderizers. Bouillon cubes. Hot sauces. Pre-made or packaged marinades. Pre-made or packaged  taco seasonings. Relishes. Regular salad dressings. Other foods Salted popcorn and pretzels. The items listed above may not be a complete list of foods and beverages you should avoid. Contact a dietitian for more information. Where to find more information  National Heart, Lung, and Blood Institute: PopSteam.is  American Heart Association: www.heart.org  Academy of Nutrition and Dietetics: www.eatright.org  National Kidney Foundation: www.kidney.org Summary  The DASH eating plan is a healthy eating plan that has been shown to reduce high blood pressure (hypertension). It may also reduce your risk for type 2 diabetes, heart disease, and stroke.  When on the DASH eating plan, aim to eat more fresh fruits and vegetables, whole grains, lean proteins, low-fat dairy, and heart-healthy fats.  With the DASH eating plan, you should limit salt (sodium) intake to 2,300 mg a day. If you have hypertension, you may need to reduce your  sodium intake to 1,500 mg a day.  Work with your health care provider or dietitian to adjust your eating plan to your individual calorie needs. This information is not intended to replace advice given to you by your health care provider. Make sure you discuss any questions you have with your health care provider. Document Revised: 01/25/2019 Document Reviewed: 01/25/2019 Elsevier Patient Education  2021 [employer].

## 2020-07-10 NOTE — Progress Notes (Signed)
Office Visit    Patient Name: Shane Dominguez Date of Encounter: 07/10/2020  PCP:  Patient, No Pcp Per (Inactive)   [facility]  Cardiologist:  [name], MD  Advanced Practice Provider:  No care team member to display Electrophysiologist:  None  [id number]   Chief Complaint    Chief Complaint  Patient presents with  . Follow-up    Follow up and medications verbally reviewed with patient.     60 y.o. male with history of hypertension, hepatitis C, tobacco abuse, GERD, and who presents today for 3 month follow-up of hypertension.  Past Medical History    Past Medical History:  Diagnosis Date  . Acute appendicitis   . Appendicitis 08/18/2016  . GERD (gastroesophageal reflux disease)    NO MEDS  . Hepatitis C   . Hypertension   . Tobacco abuse    Past Surgical History:  Procedure Laterality Date  . HERNIA REPAIR  11/18/2016  . INGUINAL HERNIA REPAIR Bilateral 11/17/2016   Procedure: LAPAROSCOPIC BILATERAL INGUINAL HERNIA REPAIR;  Surgeon: Leafy Ro, MD;  Location: [facility] ORS;  Service: General;  Laterality: Bilateral;  . INSERTION OF MESH Bilateral 11/17/2016   Procedure: INSERTION OF MESH;  Surgeon: Leafy Ro, MD;  Location: [facility] OR[facility];  Service: General;  Laterality: Bilateral;  . LAPAROSCOPIC APPENDECTOMY N/A 08/18/2016   Procedure: APPENDECTOMY LAPAROSCOPIC;  Surgeon: Leafy Ro, MD;  Location: [facility] OR[facility];  Service: General;  Laterality: N/A;    Allergies  No Known Allergies  History of Present Illness    Shane Dominguez is a 60 y.o. male with PMH as above.  He has history of hypertension, hepatitis C, tobacco use, and GERD.  He has been seen several times in the clinic over the past 2 years for uncontrolled hypertension and atypical chest pain, improved with the addition of amlodipine.  He underwent stress testing in October 2019, which was ruled low risk and showed normal EF.    Further ischemic evaluation was deferred, as his  chest pain was thought to be likely related to GERD.  His blood pressure remained uncontrolled in the setting of nonadherence and excessive sodium intake.  He was seen in the clinic 11/01/2019 for uncontrolled hypertension.  He agreed to restart his amlodipine, reduce his sodium intake, and follow-up in 3 months.  Smoking cessation was advised.    He was seen again 02/07/2020, and reported doing much better, though his blood pressure still remained above goal.  He reported ongoing personal stressors related to the death of his significant others daughter.  He was taking his amlodipine as prescribed; however, he was not checking his blood pressure at home, and his sodium intake remained high.  He denied any chest pain or dyspnea.  Amlodipine increased to 10 mg daily with recommendation to follow-up in 3 months.  Smoking cessation advised.  Today, 07/10/2020, he returns to clinic with BP significantly elevated at 166/96.  He reports elevated BP due to stressors on the bus on the way over to today's visit.  He also reports ongoing stressors related to his significant other's daughter.  He has not taken his blood pressure medication before today's visit, which is likely contributing to today's elevated blood pressures.  He reports smoking about 4 cigaretes per day.  He denies any chest pain and reports stable breathing.  He does report some allergies with request for some recommendations for OTC allergy medication, provided today.  He denies any dry or  productive coughing.  No signs or symptoms of volume overload.  He reports walking for physical activity without any concerning symptoms of shortness of breath or chest pain.  He does own a blood pressure cuff with recommendation to bring it into his next visit, so that we are able to check it against our office cuff.  We will start to regularly check his blood pressure and log the entries.  We did discuss accurate technique for blood pressure measurement today with blood  pressure recommendations/techniques provided on his AVS, as well as a reminder to bring his cuff into the next visit.  Also provided on his AVS was a recommendation to remember to take his blood pressure medications before his next visit.  He requested information regarding the Dash eating plan, provided on his AVS as well.  Salt and fluid restrictions reviewed with patient understanding.  He reports he will call the office if BP consistently elevated at home as discussed today. He was running late today, which was the reason he did not take his BP medication this AM.  Home Medications   Current Outpatient Medications  Medication Instructions  . amLODipine (NORVASC) 10 mg, Oral, Daily  . Multiple Vitamin (MULTIVITAMIN) tablet 1 tablet, Oral, Daily  . omeprazole (PRILOSEC OTC) 20 mg, Oral, Daily     Review of Systems    He reports allergies. He denies chest pain, palpitations, dyspnea, pnd, orthopnea, n, v, dizziness, syncope, edema, weight gain, or early satiety.    All other systems reviewed and are otherwise negative except as noted above.  Physical Exam    VS:  BP (!) 166/96 (BP Location: Left Arm, Patient Position: Sitting, Cuff Size: Normal)   Pulse 86   Ht 6\' 1"  (1.854 m)   Wt 149 lb (67.6 kg)   SpO2 98%   BMI 19.66 kg/m  , BMI Body mass index is 19.66 kg/m. GEN: Well nourished, well developed, in no acute distress. HEENT: normal. Neck: Supple, no JVD, carotid bruits, or masses. Cardiac: RRR, no murmurs, rubs, or gallops. No clubbing, cyanosis, edema.  Radials/DP/PT 2+ and equal bilaterally.  Respiratory:  Respirations regular and unlabored, clear to auscultation bilaterally. GI: Soft, nontender, nondistended, BS + x 4. MS: no deformity or atrophy. Skin: warm and dry, no rash. Neuro:  Strength and sensation are intact. Psych: Normal affect.  Accessory Clinical Findings    ECG personally reviewed by me today - NSR with first degree AVB, 86bpm, PRi , poor R wave  progression II, III, avF, V1-V2, LVH repolarization changes - changes in V2 seen on prior EKG - no acute changes.  VITALS Reviewed today   Temp Readings from Last 3 Encounters:  06/24/19 (!) 97.2 F (36.2 C)  06/19/19 99.2 F (37.3 C) (Oral)  06/12/19 98.8 F (37.1 C)   BP Readings from Last 3 Encounters:  07/10/20 (!) 166/96  02/07/20 (!) 144/88  11/01/19 (!) 170/96   Pulse Readings from Last 3 Encounters:  07/10/20 86  02/07/20 88  11/01/19 87    Wt Readings from Last 3 Encounters:  07/10/20 149 lb (67.6 kg)  02/07/20 147 lb (66.7 kg)  11/01/19 149 lb (67.6 kg)     LABS  reviewed today   Lab Results  Component Value Date   WBC 9.5 06/19/2019   HGB 16.0 06/19/2019   HCT 45.4 06/19/2019   MCV 96.2 06/19/2019   PLT 250 06/19/2019   Lab Results  Component Value Date   CREATININE 0.66 06/19/2019   BUN  15 06/19/2019   NA 136 06/19/2019   K 4.5 06/19/2019   CL 99 06/19/2019   CO2 30 06/19/2019   Lab Results  Component Value Date   ALT 22 08/18/2016   AST 28 08/18/2016   ALKPHOS 53 08/18/2016   BILITOT 1.1 08/18/2016   Lab Results  Component Value Date   CHOL 131 12/20/2017   HDL 35 (L) 12/20/2017   LDLCALC 85 12/20/2017   TRIG 57 12/20/2017   CHOLHDL 3.7 12/20/2017    Lab Results  Component Value Date   HGBA1C 5.7 05/01/2013   Lab Results  Component Value Date   TSH 1.83 05/01/2013     STUDIES/PROCEDURES reviewed today   Cardiovascular History & Procedures: Cardiovascular Problems:  Atypical chest pain  Risk Factors:  Hypertension, male gender, tobacco use, and age greater than 58  Cath/PCI:  None  CV Surgery:  None  EP Procedures and Devices:  None  Non-Invasive Evaluation(s):  Pharmacologic myocardial perfusion stress test (12/20/2017): Abnormal, probably low risk myocardial perfusion stress test. There is a small in size, severe, fixed basal and mid inferoseptal defect consistent with scar but cannot rule out  artifact. No significant ischemia is identified. LVEF greater than 65% with normal wall motion. Significant extracardiac activity and diaphragmatic attenuation noted.  Transthoracic echocardiogram (12/20/2017): Normal LV size and wall thickness. LVEF 60-65% with normal wall motion and diastolic function. Mild mitral regurgitation. Normal RV size and function.   Assessment & Plan    Essential Hypertension / Uncontrolled Hypertension -- BP uncontrolled at 166/96.  He did not take his medications this morning, as he was running late.  We reviewed proper BP measurement technique with recommendation to start monitoring his blood pressure on a daily basis.  He will log his blood pressures and bring his cuff in to his next visit.  He will make sure he takes his medications before his next visit as well, as we discussed that it is difficult to adjust his blood pressure medications when we do not know if his blood pressure is controlled on his current dose of amlodipine.  Long discussion regarding the DASH diet, BP measurement, and medication compliance with patient understanding.  Reviewed limiting total daily fluids to 2 L and salt under 2 g.  Tobacco use -- Complete smoking cessation advised.  Long discussion regarding barriers to quitting with patient understanding that this is important to his cardiovascular health.  Medication changes: None, as he did not take his amlodipine this AM, so we do not know if it is controlled on this medication Labs ordered: None Studies / Imaging ordered: None Future considerations: Adjustment of antihypertensives, if needed at RTC Disposition: RTC 2 weeks with BP cuff and log --> take medications before visit  *Please be aware that the above documentation was completed voice recognition software and may contain dictation errors.      Lennon Alstrom, PA-C 07/10/2020

## 2020-07-22 NOTE — Progress Notes (Signed)
Office Visit    Patient Name: Shane Dominguez Date of Encounter: 07/24/2020  PCP:  Patient, No Pcp Per (Inactive)   [facility]  Cardiologist:  [name], MD  Advanced Practice Provider:  No care team member to display Electrophysiologist:  None  [id number]   Chief Complaint    Chief Complaint  Patient presents with  . Follow-up    2 Week follow up. Medications verbally reviewed with patient.     60 y.o. male with history of hypertension, hepatitis C, tobacco abuse, GERD, and who presents today for 2-week follow-up.  Past Medical History    Past Medical History:  Diagnosis Date  . Acute appendicitis   . Appendicitis 08/18/2016  . GERD (gastroesophageal reflux disease)    NO MEDS  . Hepatitis C   . Hypertension   . Tobacco abuse    Past Surgical History:  Procedure Laterality Date  . HERNIA REPAIR  11/18/2016  . INGUINAL HERNIA REPAIR Bilateral 11/17/2016   Procedure: LAPAROSCOPIC BILATERAL INGUINAL HERNIA REPAIR;  Surgeon: Leafy Ro, MD;  Location: [facility] OR[facility];  Service: General;  Laterality: Bilateral;  . INSERTION OF MESH Bilateral 11/17/2016   Procedure: INSERTION OF MESH;  Surgeon: Leafy Ro, MD;  Location: [facility] OR[facility];  Service: General;  Laterality: Bilateral;  . LAPAROSCOPIC APPENDECTOMY N/A 08/18/2016   Procedure: APPENDECTOMY LAPAROSCOPIC;  Surgeon: Leafy Ro, MD;  Location: [facility] OR[facility];  Service: General;  Laterality: N/A;    Allergies  No Known Allergies  History of Present Illness    Shane Dominguez is a 60 y.o. male with PMH as above.  He has history of hypertension, hepatitis C, tobacco use, and GERD.  He has been seen several times in the clinic over the past 2 years for uncontrolled hypertension and atypical chest pain, improved with the addition of amlodipine.  He underwent stress testing in October 2019, which was ruled low risk and showed normal EF.    Further ischemic evaluation was deferred, as his chest pain  was thought to be likely related to GERD.  His blood pressure remained uncontrolled in the setting of nonadherence and excessive sodium intake.  He was seen in the clinic 11/01/2019 for uncontrolled hypertension.  He agreed to restart his amlodipine, reduce his sodium intake, and follow-up in 3 months.  Smoking cessation was advised.    He was seen again 02/07/2020, and reported doing much better, though his blood pressure still remained above goal.  He reported ongoing personal stressors related to the death of his significant others daughter.  He was taking his amlodipine as prescribed; however, he was not checking his blood pressure at home, and his sodium intake remained high.  He denied any chest pain or dyspnea.  Amlodipine increased to 10 mg daily with recommendation to follow-up in 3 months.  Smoking cessation advised.  Seen 07/10/2020 with BP 166/96.  He attributed elevated BP to stressors on the bus ride to the visit.  He also noted ongoing stressors related to his significant other's daughter.  He had not yet taken his blood pressure medication before the visit and due to running late.  He was smoking 4 cigarettes/day.  He reported allergies.  He was walking for physical activity without any concerning symptoms.  He stated he owned a blood pressure cuff with recommendation to keep a BP log and bring his BP cuff into his next visit, so that we are able to check it against our office cuff.  Diet discussed at length.  Today, 07/24/2020, he returns to clinic and notes that he is doing the same as his previous clinic visit.  He has cut down to smoking 2 cigarettes/day, reduced from 4 cigarettes/day.  Blood pressure noted to be elevated with patient report this is 2/2 eating a bag of sour cream and onions and the bus ride over.  He took his medications before today's visit.  He reports trying to remain active with push-ups every morning.  He has been working in the yard without any chest pain.  He is walking  for physical activity and reports walking [employer] 3-4 times per day.  Diet and lifestyle reviewed.  No signs or symptoms of bleeding.  He indicates a preference today to not have any medication changes, though it was clarified that this visit was specifically scheduled for medication changes, as we were not able to make changes at his previous clinic visit (as above, he did not take his medications before his 5/6 clinic visit, and with recommendation to take them before this visit so that we could adjust his medications appropriately).   Home Medications   Current Outpatient Medications  Medication Instructions  . amLODipine (NORVASC) 10 mg, Oral, Daily  . Multiple Vitamin (MULTIVITAMIN) tablet 1 tablet, Oral, Daily  . omeprazole (PRILOSEC OTC) 20 mg, Oral, Daily     Review of Systems    He reports allergies. He denies chest pain, palpitations, dyspnea, pnd, orthopnea, n, v, dizziness, syncope, edema, weight gain, or early satiety.    All other systems reviewed and are otherwise negative except as noted above.  Physical Exam    VS:  BP (!) 144/84 (BP Location: Left Arm, Patient Position: Sitting, Cuff Size: Normal)   Pulse 92   Ht 6' (1.829 m)   Wt 144 lb (65.3 kg)   SpO2 98%   BMI 19.53 kg/m  , BMI Body mass index is 19.53 kg/m. GEN: Well nourished, well developed, in no acute distress. HEENT: normal. Neck: Supple, no JVD, carotid bruits, or masses. Cardiac: RRR, no murmurs, rubs, or gallops. No clubbing, cyanosis, edema.  Radials/DP/PT 2+ and equal bilaterally.  Respiratory:  Respirations regular and unlabored, clear to auscultation bilaterally. GI: Soft, nontender, nondistended, BS + x 4. MS: no deformity or atrophy. Skin: warm and dry, no rash. Neuro:  Strength and sensation are intact. Psych: Normal affect.  Accessory Clinical Findings    ECG personally reviewed by me today - NSR with prolonged PR interval at 198 ms, 80 bpm, poor R wave progression II, III, avF, V1-V3,  LVH with repolarization abnormalities - no acute changes.  VITALS Reviewed today   Temp Readings from Last 3 Encounters:  06/24/19 (!) 97.2 F (36.2 C)  06/19/19 99.2 F (37.3 C) (Oral)  06/12/19 98.8 F (37.1 C)   BP Readings from Last 3 Encounters:  07/24/20 (!) 144/84  07/10/20 (!) 166/96  02/07/20 (!) 144/88   Pulse Readings from Last 3 Encounters:  07/24/20 92  07/10/20 86  02/07/20 88    Wt Readings from Last 3 Encounters:  07/24/20 144 lb (65.3 kg)  07/10/20 149 lb (67.6 kg)  02/07/20 147 lb (66.7 kg)     LABS  reviewed today   Lab Results  Component Value Date   WBC 9.5 06/19/2019   HGB 16.0 06/19/2019   HCT 45.4 06/19/2019   MCV 96.2 06/19/2019   PLT 250 06/19/2019   Lab Results  Component Value Date   CREATININE 0.66 06/19/2019  BUN 15 06/19/2019   NA 136 06/19/2019   K 4.5 06/19/2019   CL 99 06/19/2019   CO2 30 06/19/2019   Lab Results  Component Value Date   ALT 22 08/18/2016   AST 28 08/18/2016   ALKPHOS 53 08/18/2016   BILITOT 1.1 08/18/2016   Lab Results  Component Value Date   CHOL 131 12/20/2017   HDL 35 (L) 12/20/2017   LDLCALC 85 12/20/2017   TRIG 57 12/20/2017   CHOLHDL 3.7 12/20/2017    Lab Results  Component Value Date   HGBA1C 5.7 05/01/2013   Lab Results  Component Value Date   TSH 1.83 05/01/2013     STUDIES/PROCEDURES reviewed today   Cardiovascular History & Procedures: Cardiovascular Problems:  Atypical chest pain  Risk Factors:  Hypertension, male gender, tobacco use, and age greater than 36  Cath/PCI:  None  CV Surgery:  None  EP Procedures and Devices:  None  Non-Invasive Evaluation(s):  Pharmacologic myocardial perfusion stress test (12/20/2017): Abnormal, probably low risk myocardial perfusion stress test. There is a small in size, severe, fixed basal and mid inferoseptal defect consistent with scar but cannot rule out artifact. No significant ischemia is identified. LVEF greater  than 65% with normal wall motion. Significant extracardiac activity and diaphragmatic attenuation noted.  Transthoracic echocardiogram (12/20/2017): Normal LV size and wall thickness. LVEF 60-65% with normal wall motion and diastolic function. Mild mitral regurgitation. Normal RV size and function.   Assessment & Plan    Essential Hypertension / Uncontrolled Hypertension -- BP today still uncontrolled, despite taking his amlodipine before this visit.  At his visit on 5/6, we discussed that I would have to defer medication changes, as he had not taken his amlodipine that day.  Today's visit was primarily scheduled to make medication changes, which I previously was unable to make, given we do not know his BP on amlodipine.  Given elevated BP today, discussed optimizing BP control with antihypertensives with patient preference now to avoid any medication changes.  He will call the office if his heart rate remains elevated once home, given his weight was elevated at 92 bpm today.  He did not bring in a BP log or his BP cuff-encouraged to bring at RTC.   Again reviewed DASH diet, BP measurement, and medication compliance.  Tobacco use -- Complete smoking cessation advised.  He has successfully reduced from 4 cigarettes daily to 2 cigarettes/day with congratulations provided.  Medication changes: Declined by patient Labs ordered: None Studies / Imaging ordered: None Future considerations: Adjustment of antihypertensives, if needed at RTC. Disposition: RTC 3 months and recommend that he see Dr. Cristal [place] at this time.  Again recommend he bring his blood pressure cuff/BP log to this visit.  Reviewed recommendation for him to take his amlodipine before this office visit as well.  *Please be aware that the above documentation was completed voice recognition software and may contain dictation errors.      Lennon Alstrom, PA-C 07/24/2020

## 2020-07-24 ENCOUNTER — Other Ambulatory Visit: Payer: Self-pay

## 2020-07-24 ENCOUNTER — Encounter: Payer: Self-pay | Admitting: Physician Assistant

## 2020-07-24 ENCOUNTER — Ambulatory Visit (INDEPENDENT_AMBULATORY_CARE_PROVIDER_SITE_OTHER): Payer: Medicaid Other | Admitting: Physician Assistant

## 2020-07-24 VITALS — BP 144/84 | HR 92 | Ht 72.0 in | Wt 144.0 lb

## 2020-07-24 DIAGNOSIS — I10 Essential (primary) hypertension: Secondary | ICD-10-CM

## 2020-07-24 DIAGNOSIS — Z720 Tobacco use: Secondary | ICD-10-CM

## 2020-07-24 DIAGNOSIS — Z79899 Other long term (current) drug therapy: Secondary | ICD-10-CM | POA: Diagnosis not present

## 2020-07-24 NOTE — Patient Instructions (Addendum)
Medication Instructions:  Your physician recommends that you continue on your current medications as directed. Please refer to the Current Medication list given to you today.  *If you need a refill on your cardiac medications before your next appointment, please call your pharmacy*  **Please call the office for a sooner appointment if your HR is staying above 110 at rest.  Lab Work:  None ordered  Testing/Procedures:  None ordered   Follow-Up: At [facility], you and your health needs are our priority.  As part of our continuing mission to provide you with exceptional heart care, we have created designated Provider Care Teams.  These Care Teams include your primary Cardiologist (physician) and Advanced Practice Providers (APPs -  Physician Assistants and Nurse Practitioners) who all work together to provide you with the care you need, when you need it.  We recommend signing up for the patient portal called "MyChart".  Sign up information is provided on this After Visit Summary.  MyChart is used to connect with patients for Virtual Visits (Telemedicine).  Patients are able to view lab/test results, encounter notes, upcoming appointments, etc.  Non-urgent messages can be sent to your provider as well.   To learn more about what you can do with MyChart, go to [email or web address].    Your next appointment:   3 month(s)  The format for your next appointment:   In Person  Provider:   [name], MD

## 2020-07-26 ENCOUNTER — Encounter: Payer: Self-pay | Admitting: Physician Assistant

## 2020-07-31 ENCOUNTER — Telehealth: Payer: Self-pay | Admitting: Internal Medicine

## 2020-07-31 NOTE — Telephone Encounter (Signed)
error 

## 2020-07-31 NOTE — Telephone Encounter (Signed)
*  STAT* If patient is at the pharmacy, call can be transferred to refill team.   1. Which medications need to be refilled? (please list name of each medication and dose if known) Amlodipine, 1 tablet daily  2. Which pharmacy/location (including street and city if local pharmacy) is medication to be sent to? [facility], [address]  3. Do they need a 30 day or 90 day supply? 90 day

## 2020-08-04 ENCOUNTER — Other Ambulatory Visit: Payer: Self-pay

## 2020-08-04 MED ORDER — AMLODIPINE BESYLATE 10 MG PO TABS: 10.0000 mg | ORAL_TABLET | Freq: Every day | ORAL | 0 refills | Status: DC

## 2020-08-04 NOTE — Telephone Encounter (Signed)
amLODipine (NORVASC) 10 MG tablet 90 tablet 0 08/04/2020 11/02/2020   Sig - Route: Take 1 tablet (10 mg total) by mouth daily. - Oral   Sent to pharmacy as: amLODipine (NORVASC) 10 MG tablet   E-Prescribing Status: Sent to pharmacy (08/04/2020 11:34 AM EDT)     Pharmacy  [id number] - [place] ([place]), [place] - [address]

## 2020-08-04 NOTE — Telephone Encounter (Signed)
*  STAT* If patient is at the pharmacy, call can be transferred to refill team.   1. Which medications need to be refilled? (please list name of each medication and dose if known) Amlodipin  2. Which pharmacy/location (including street and city if local pharmacy) is medication to be sent to? [facility] [address]  3. Do they need a 30 day or 90 day supply? 90

## 2020-08-04 NOTE — Telephone Encounter (Signed)
Rx request sent to pharmacy.  

## 2020-10-20 ENCOUNTER — Ambulatory Visit: Payer: Medicaid Other | Admitting: Physician Assistant

## 2020-10-21 ENCOUNTER — Other Ambulatory Visit: Payer: Self-pay

## 2020-10-21 ENCOUNTER — Encounter: Payer: Self-pay | Admitting: Internal Medicine

## 2020-10-21 ENCOUNTER — Ambulatory Visit (INDEPENDENT_AMBULATORY_CARE_PROVIDER_SITE_OTHER): Payer: Medicaid Other | Admitting: Internal Medicine

## 2020-10-21 VITALS — BP 130/80 | HR 82 | Ht 73.0 in | Wt 146.0 lb

## 2020-10-21 DIAGNOSIS — K219 Gastro-esophageal reflux disease without esophagitis: Secondary | ICD-10-CM | POA: Diagnosis not present

## 2020-10-21 DIAGNOSIS — I10 Essential (primary) hypertension: Secondary | ICD-10-CM | POA: Diagnosis not present

## 2020-10-21 DIAGNOSIS — Z87891 Personal history of nicotine dependence: Secondary | ICD-10-CM

## 2020-10-21 MED ORDER — FAMOTIDINE 20 MG PO TABS: 20.0000 mg | ORAL_TABLET | Freq: Two times a day (BID) | ORAL | Status: DC

## 2020-10-21 MED ORDER — AMLODIPINE BESYLATE 10 MG PO TABS: 10.0000 mg | ORAL_TABLET | Freq: Every day | ORAL | 3 refills | Status: DC

## 2020-10-21 NOTE — Progress Notes (Signed)
Follow-up Outpatient Visit Date: 10/21/2020  Primary Care Provider: Patient, No Pcp Per (Inactive) No address on file  Chief Complaint: Follow-up hypertension  HPI:  Shane Dominguez is a 60 y.o. male with history of HTN, hepatitis C, and GERD, who presents for follow-up of hypertension.  He was last seen in our office in May by Marisue Ivan, PA, at which time he was feeling relatively well.  He was not compliant with a low-sodium diet and attributed his elevated blood pressures to this.  He declined further medication changes.  Today, [name] reports that he is feeling quite well.  He reports being compliant with amlodipine and has noticed that his blood pressure control has been much better.  He also quit smoking about a month ago.  He notes some acid reflux, that he describes as a tightness in his chest after eating.  He has not had much improvement with omeprazole but feels like as needed Tums helps.  He does not have any exertional chest pain or shortness of breath.  He also denies palpitations, lightheadedness, and edema.  --------------------------------------------------------------------------------------------------  Cardiovascular History & Procedures: Cardiovascular Problems: Hypertension Atypical chest pain   Risk Factors: Hypertension, male gender, tobacco use, and age greater than 61   Cath/PCI: None   CV Surgery: None   EP Procedures and Devices: None   Non-Invasive Evaluation(s): Pharmacologic myocardial perfusion stress test (12/20/2017): Abnormal, probably low risk myocardial perfusion stress test.  There is a small in size, severe, fixed basal and mid inferoseptal defect consistent with scar but cannot rule out artifact.  No significant ischemia is identified.  LVEF greater than 65% with normal wall motion.  Significant extracardiac activity and diaphragmatic attenuation noted. Transthoracic echocardiogram (12/20/2017): Normal LV size and wall thickness.   LVEF 60-65% with normal wall motion and diastolic function.  Mild mitral regurgitation.  Normal RV size and function.  Recent CV Pertinent Labs: Lab Results  Component Value Date   CHOL 131 12/20/2017   HDL 35 (L) 12/20/2017   LDLCALC 85 12/20/2017   TRIG 57 12/20/2017   CHOLHDL 3.7 12/20/2017   INR 1.16 08/18/2016   K 4.5 06/19/2019   K 3.5 07/18/2013   BUN 15 06/19/2019   BUN 10 07/18/2013   CREATININE 0.66 06/19/2019   CREATININE 0.84 07/18/2013    Past medical and surgical history were reviewed and updated in EPIC.  Current Meds  Medication Sig   amLODipine (NORVASC) 10 MG tablet Take 1 tablet (10 mg total) by mouth daily.   Multiple Vitamin (MULTIVITAMIN) tablet Take 1 tablet by mouth daily.     Allergies: Patient has no known allergies.  Social History   Tobacco Use   Smoking status: Former    Packs/day: 1.50    Years: 40.00    Pack years: 60.00    Types: Cigarettes    Quit date: 02/16/2018    Years since quitting: 2.6   Smokeless tobacco: Never   Tobacco comments:    10/21/2020 Quit smoking 30 days ago  Vaping Use   Vaping Use: Never used  Substance Use Topics   Alcohol use: Not Currently   Drug use: No    Family History  Problem Relation Age of Onset   Stroke Mother    COPD Father    Emphysema Father    Heart attack Brother    Stroke Brother     Review of Systems: A 12-system review of systems was performed and was negative except as noted in the HPI.  --------------------------------------------------------------------------------------------------  Physical Exam: BP 130/80 (BP Location: Left Arm, Patient Position: Sitting, Cuff Size: Normal)   Pulse 82   Ht 6\' 1"  (1.854 m)   Wt 146 lb (66.2 kg)   SpO2 98%   BMI 19.26 kg/m   General:  NAD. Neck: No JVD or HJR. Lungs: Clear to auscultation bilaterally without wheezes or crackles. Heart: Regular rate and rhythm with 2/6 systolic murmur.  No rubs or gallops. Abdomen: Soft, nontender,  nondistended. Extremities: No lower extremity edema.   Lab Results  Component Value Date   WBC 9.5 06/19/2019   HGB 16.0 06/19/2019   HCT 45.4 06/19/2019   MCV 96.2 06/19/2019   PLT 250 06/19/2019    Lab Results  Component Value Date   NA 136 06/19/2019   K 4.5 06/19/2019   CL 99 06/19/2019   CO2 30 06/19/2019   BUN 15 06/19/2019   CREATININE 0.66 06/19/2019   GLUCOSE 131 (H) 06/19/2019   ALT 22 08/18/2016    Lab Results  Component Value Date   CHOL 131 12/20/2017   HDL 35 (L) 12/20/2017   LDLCALC 85 12/20/2017   TRIG 57 12/20/2017   CHOLHDL 3.7 12/20/2017    --------------------------------------------------------------------------------------------------  ASSESSMENT AND PLAN: Hypertension: Blood pressure upper normal today, much improved from prior visits.  We have agreed to continue amlodipine 10 mg daily.  Sodium restriction was reinforced.  GERD: I have recommended empiric course of famotidine 20 mg twice daily.  If symptoms persist, GI evaluation will need to be considered.  History of tobacco use: I congratulated [name] on having quit smoking and encouraged him to remain abstinent.  Follow-up: Return to clinic in 6 months.  Kem Parkinson, MD 10/21/2020 10:56 AM

## 2020-10-21 NOTE — Patient Instructions (Signed)
Medication Instructions:   Your physician has recommended you make the following change in your medication:   START Famotidine (Pecid) 20 mg TWICE daily - you may pick this up over-the-counter  We have sent in refills for your Amlodipine.  *If you need a refill on your cardiac medications before your next appointment, please call your pharmacy*   Lab Work:  None ordered  Testing/Procedures:  None ordered   Follow-Up: At [facility], you and your health needs are our priority.  As part of our continuing mission to provide you with exceptional heart care, we have created designated Provider Care Teams.  These Care Teams include your primary Cardiologist (physician) and Advanced Practice Providers (APPs -  Physician Assistants and Nurse Practitioners) who all work together to provide you with the care you need, when you need it.  We recommend signing up for the patient portal called "MyChart".  Sign up information is provided on this After Visit Summary.  MyChart is used to connect with patients for Virtual Visits (Telemedicine).  Patients are able to view lab/test results, encounter notes, upcoming appointments, etc.  Non-urgent messages can be sent to your provider as well.   To learn more about what you can do with MyChart, go to [email or web address].    Your next appointment:   6 month(s)  The format for your next appointment:   In Person  Provider:   You may see [name], MD or one of the following Advanced Practice Providers on your designated Care Team:   Nicolasa Ducking, NP Eula Listen, PA-C Marisue Ivan, PA-C Cadence Callao, [place]

## 2021-04-28 ENCOUNTER — Ambulatory Visit (INDEPENDENT_AMBULATORY_CARE_PROVIDER_SITE_OTHER): Payer: Medicaid Other | Admitting: Internal Medicine

## 2021-04-28 ENCOUNTER — Other Ambulatory Visit: Payer: Self-pay

## 2021-04-28 ENCOUNTER — Encounter: Payer: Self-pay | Admitting: Internal Medicine

## 2021-04-28 VITALS — BP 172/90 | HR 89 | Ht 73.0 in | Wt 142.0 lb

## 2021-04-28 DIAGNOSIS — I10 Essential (primary) hypertension: Secondary | ICD-10-CM

## 2021-04-28 NOTE — Patient Instructions (Signed)
Medication Instructions:   Your physician recommends that you continue on your current medications as directed. Please refer to the Current Medication list given to you today.  *If you need a refill on your cardiac medications before your next appointment, please call your pharmacy*   Lab Work:  None ordered  Testing/Procedures:  None ordered   Follow-Up: At [facility], you and your health needs are our priority.  As part of our continuing mission to provide you with exceptional heart care, we have created designated Provider Care Teams.  These Care Teams include your primary Cardiologist (physician) and Advanced Practice Providers (APPs -  Physician Assistants and Nurse Practitioners) who all work together to provide you with the care you need, when you need it.  We recommend signing up for the patient portal called "MyChart".  Sign up information is provided on this After Visit Summary.  MyChart is used to connect with patients for Virtual Visits (Telemedicine).  Patients are able to view lab/test results, encounter notes, upcoming appointments, etc.  Non-urgent messages can be sent to your provider as well.   To learn more about what you can do with MyChart, go to [email or web address].    Your next appointment:   3 month(s)  The format for your next appointment:   In Person  Provider:   You may see [name], MD or one of the following Advanced Practice Providers on your designated Care Team:   Nicolasa Ducking, NP Eula Listen, PA-C Cadence Fransico Michael, PA-C   Other Instructions  Continue to work on lifestyle changes to improve blood pressure.

## 2021-04-28 NOTE — Progress Notes (Signed)
Follow-up Outpatient Visit Date: 04/28/2021  Primary Care Provider: Patient, No Pcp Per (Inactive) No address on file  Chief Complaint: High blood pressure  HPI:  Mr. Striegel is a 61 y.o. male with history of hypertension, hepatitis C, and GERD, who presents for follow-up of hypertension.  I last saw him in 10/2020, at which time he was feeling well.  He reported improved blood pressure control with compliance with amlodipine.  He also had quit smoking.  He noted some chest tightness after eating that he attributed to acid reflux.  He had not had much improvement in the past with omeprazole but felt like Tums was beneficial.  We agreed to a trial of famotidine 20 mg twice daily.  No other medication changes or additional testing or pursued.  Today, Mr. Mercure reports that he has started smoking again and has not been following a low-sodium diet.  He has also been under quite a bit of stress at home recently.  He attributes his elevated blood pressure to these factors.  His blood pressure has been doing well when he was compliant with low-sodium diet and was not smoking.  He continues to take amlodipine 10 mg daily.  He denies chest pain, shortness of breath, palpitations, lightheadedness, or edema.  He is no longer on famotidine but reports that his GERD is well controlled with as needed Tums.  --------------------------------------------------------------------------------------------------  Cardiovascular History & Procedures: Cardiovascular Problems: Hypertension Atypical chest pain   Risk Factors: Hypertension, male gender, tobacco use, and age greater than 30   Cath/PCI: None   CV Surgery: None   EP Procedures and Devices: None   Non-Invasive Evaluation(s): Pharmacologic myocardial perfusion stress test (12/20/2017): Abnormal, probably low risk myocardial perfusion stress test.  There is a small in size, severe, fixed basal and mid inferoseptal defect consistent with scar but  cannot rule out artifact.  No significant ischemia is identified.  LVEF greater than 65% with normal wall motion.  Significant extracardiac activity and diaphragmatic attenuation noted. Transthoracic echocardiogram (12/20/2017): Normal LV size and wall thickness.  LVEF 60-65% with normal wall motion and diastolic function.  Mild mitral regurgitation.  Normal RV size and function.  Recent CV Pertinent Labs: Lab Results  Component Value Date   CHOL 131 12/20/2017   HDL 35 (L) 12/20/2017   LDLCALC 85 12/20/2017   TRIG 57 12/20/2017   CHOLHDL 3.7 12/20/2017   INR 1.16 08/18/2016   K 4.5 06/19/2019   K 3.5 07/18/2013   BUN 15 06/19/2019   BUN 10 07/18/2013   CREATININE 0.66 06/19/2019   CREATININE 0.84 07/18/2013    Past medical and surgical history were reviewed and updated in EPIC.  Current Meds  Medication Sig   amLODipine (NORVASC) 10 MG tablet Take 1 tablet (10 mg total) by mouth daily.   Multiple Vitamin (MULTIVITAMIN) tablet Take 1 tablet by mouth daily.     Allergies: Patient has no known allergies.  Social History   Tobacco Use   Smoking status: Every Day    Packs/day: 1.50    Years: 40.00    Pack years: 60.00    Types: Cigarettes    Last attempt to quit: 02/16/2018    Years since quitting: 3.1   Smokeless tobacco: Never   Tobacco comments:    10/21/2020 Quit smoking 30 days ago       04/28/2021 1/2 PPD  Vaping Use   Vaping Use: Never used  Substance Use Topics   Alcohol use: Not Currently   Drug  use: No    Family History  Problem Relation Age of Onset   Stroke Mother    COPD Father    Emphysema Father    Heart attack Brother    Stroke Brother     Review of Systems: A 12-system review of systems was performed and was negative except as noted in the HPI.  --------------------------------------------------------------------------------------------------  Physical Exam: BP (!) 172/90 (BP Location: Left Arm, Patient Position: Sitting, Cuff Size: Normal)     Pulse 89    Ht 6\' 1"  (1.854 m)    Wt 142 lb (64.4 kg)    SpO2 98%    BMI 18.73 kg/m   General:  NAD. Neck: No JVD or HJR. Lungs: Clear to auscultation bilaterally without wheezes or crackles. Heart: Regular rate and rhythm without murmurs, rubs, or gallops. Abdomen: Soft, nontender, nondistended. Extremities: No lower extremity edema.  EKG: Normal sinus rhythm without abnormality.  Lab Results  Component Value Date   WBC 9.5 06/19/2019   HGB 16.0 06/19/2019   HCT 45.4 06/19/2019   MCV 96.2 06/19/2019   PLT 250 06/19/2019    Lab Results  Component Value Date   NA 136 06/19/2019   K 4.5 06/19/2019   CL 99 06/19/2019   CO2 30 06/19/2019   BUN 15 06/19/2019   CREATININE 0.66 06/19/2019   GLUCOSE 131 (H) 06/19/2019   ALT 22 08/18/2016    Lab Results  Component Value Date   CHOL 131 12/20/2017   HDL 35 (L) 12/20/2017   LDLCALC 85 12/20/2017   TRIG 57 12/20/2017   CHOLHDL 3.7 12/20/2017    --------------------------------------------------------------------------------------------------  ASSESSMENT AND PLAN: Hypertension: Blood pressure not well controlled today, which Mr. Darbyshire attributes to dietary indiscretion and resumption of smoking, as well as increased stress related to his home situation.  Blood pressure was upper normal at our last visit.  We discussed escalation of his antihypertensive regimen but have agreed to try lifestyle modifications again before making further medicine changes.  He should remain on amlodipine 10 mg daily.  Follow-up: Return to clinic in 3 months.  Nelva Bush, MD 04/28/2021 10:33 AM

## 2021-07-28 NOTE — Progress Notes (Signed)
Follow-up Outpatient Visit Date: 07/29/2021  Primary Care Provider: Patient, No Pcp Per (Inactive) No address on file  Chief Complaint: Hypertension  HPI:  Shane Dominguez is a 61 y.o. male with history of hypertension, hepatitis C, and GERD, who presents for follow-up of hypertension.  I last saw him in February, at which time Shane Dominguez was quite hypertensive.  He had started smoking again and was not following a low-sodium diet.  He also implicated more stress at home and his elevated blood pressure readings.  He was asymptomatic and compliant with amlodipine.  He did not wish to make any medication changes but instead focus on lifestyle modifications.  Today, Shane Dominguez reports that he is doing well.  He happily reports that he has cut down on cigarettes and is also trying to exercise more regularly.  He walks quite a bit and is also doing push-ups.  He has been trying to limit his sodium intake as well.  He denies chest pain, shortness of breath, palpitations, lightheadedness, and edema.  He remains compliant with amlodipine.  --------------------------------------------------------------------------------------------------  Cardiovascular History & Procedures: Cardiovascular Problems: Hypertension Atypical chest pain   Risk Factors: Hypertension, male gender, tobacco use, and age greater than 35   Cath/PCI: None   CV Surgery: None   EP Procedures and Devices: None   Non-Invasive Evaluation(s): Pharmacologic myocardial perfusion stress test (12/20/2017): Abnormal, probably low risk myocardial perfusion stress test.  There is a small in size, severe, fixed basal and mid inferoseptal defect consistent with scar but cannot rule out artifact.  No significant ischemia is identified.  LVEF greater than 65% with normal wall motion.  Significant extracardiac activity and diaphragmatic attenuation noted. Transthoracic echocardiogram (12/20/2017): Normal LV size and wall thickness.  LVEF  60-65% with normal wall motion and diastolic function.  Mild mitral regurgitation.  Normal RV size and function.  Recent CV Pertinent Labs: Lab Results  Component Value Date   CHOL 131 12/20/2017   HDL 35 (L) 12/20/2017   LDLCALC 85 12/20/2017   TRIG 57 12/20/2017   CHOLHDL 3.7 12/20/2017   INR 1.16 08/18/2016   K 4.5 06/19/2019   K 3.5 07/18/2013   BUN 15 06/19/2019   BUN 10 07/18/2013   CREATININE 0.66 06/19/2019   CREATININE 0.84 07/18/2013    Past medical and surgical history were reviewed and updated in EPIC.  Current Meds  Medication Sig   amLODipine (NORVASC) 10 MG tablet Take 1 tablet (10 mg total) by mouth daily.   Multiple Vitamin (MULTIVITAMIN) tablet Take 1 tablet by mouth daily.     Allergies: Patient has no known allergies.  Social History   Tobacco Use   Smoking status: Every Day    Packs/day: 1.50    Years: 40.00    Pack years: 60.00    Types: Cigarettes    Last attempt to quit: 02/16/2018    Years since quitting: 3.4   Smokeless tobacco: Never   Tobacco comments:    10/21/2020 Quit smoking 30 days ago       04/28/2021 1/2 PPD  Vaping Use   Vaping Use: Never used  Substance Use Topics   Alcohol use: Not Currently   Drug use: No    Family History  Problem Relation Age of Onset   Stroke Mother    COPD Father    Emphysema Father    Heart attack Brother    Stroke Brother     Review of Systems: A 12-system review of systems was performed and was  negative except as noted in the HPI.  --------------------------------------------------------------------------------------------------  Physical Exam: BP 126/76 (BP Location: Left Arm, Patient Position: Sitting, Cuff Size: Normal)   Pulse 82   Ht 6\' 1"  (1.854 m)   Wt 142 lb (64.4 kg)   BMI 18.73 kg/m   General:  NAD. Neck: No JVD or HJR. Lungs: Clear to auscultation bilaterally without wheezes or crackles. Heart: Regular rate and rhythm without murmurs, rubs, or gallops. Abdomen: Soft,  nontender, nondistended. Extremities: No lower extremity edema.   Lab Results  Component Value Date   WBC 9.5 06/19/2019   HGB 16.0 06/19/2019   HCT 45.4 06/19/2019   MCV 96.2 06/19/2019   PLT 250 06/19/2019    Lab Results  Component Value Date   NA 136 06/19/2019   K 4.5 06/19/2019   CL 99 06/19/2019   CO2 30 06/19/2019   BUN 15 06/19/2019   CREATININE 0.66 06/19/2019   GLUCOSE 131 (H) 06/19/2019   ALT 22 08/18/2016    Lab Results  Component Value Date   CHOL 131 12/20/2017   HDL 35 (L) 12/20/2017   LDLCALC 85 12/20/2017   TRIG 57 12/20/2017   CHOLHDL 3.7 12/20/2017    --------------------------------------------------------------------------------------------------  ASSESSMENT AND PLAN: Hypertension: Blood pressure improved today with recent lifestyle modifications.  Continue amlodipine 10 mg daily.  I encouraged Shane Dominguez to quit smoking altogether and to continue exercising.  Sodium restriction also reinforced.  Follow-up: Return to clinic in 6 months.  Shane Parkinson, MD 07/30/2021 8:28 AM

## 2021-07-29 ENCOUNTER — Ambulatory Visit (INDEPENDENT_AMBULATORY_CARE_PROVIDER_SITE_OTHER): Payer: Medicaid Other | Admitting: Internal Medicine

## 2021-07-29 ENCOUNTER — Encounter: Payer: Self-pay | Admitting: Internal Medicine

## 2021-07-29 VITALS — BP 126/76 | HR 82 | Ht 73.0 in | Wt 142.0 lb

## 2021-07-29 DIAGNOSIS — I10 Essential (primary) hypertension: Secondary | ICD-10-CM | POA: Diagnosis not present

## 2021-07-29 NOTE — Patient Instructions (Signed)
Medication Instructions:  Continue your current medications  *If you need a refill on your cardiac medications before your next appointment, please call your pharmacy*   Lab Work: None   Testing/Procedures: None   Follow-Up: At [facility], you and your health needs are our priority.  As part of our continuing mission to provide you with exceptional heart care, we have created designated Provider Care Teams.  These Care Teams include your primary Cardiologist (physician) and Advanced Practice Providers (APPs -  Physician Assistants and Nurse Practitioners) who all work together to provide you with the care you need, when you need it.  We recommend signing up for the patient portal called "MyChart".  Sign up information is provided on this After Visit Summary.  MyChart is used to connect with patients for Virtual Visits (Telemedicine).  Patients are able to view lab/test results, encounter notes, upcoming appointments, etc.  Non-urgent messages can be sent to your provider as well.   To learn more about what you can do with MyChart, go to [email or web address].    Your next appointment:   6 month(s)  The format for your next appointment:   In Person  Provider:   You may see [name], MD or one of the following Advanced Practice Providers on your designated Care Team:   Nicolasa Ducking, NP Eula Listen, PA-C Cadence Fransico Michael, [place]    Other Instructions Please try to quit smoking  Important Information About Sugar

## 2021-07-30 ENCOUNTER — Encounter: Payer: Self-pay | Admitting: Internal Medicine

## 2021-10-30 ENCOUNTER — Other Ambulatory Visit: Payer: Self-pay | Admitting: Internal Medicine

## 2021-11-25 IMAGING — CR DG CHEST 2V
1 series · 2 of 2 positions shown · non-contrast
Comparison: 12/19/2017

CLINICAL DATA: Dizziness, acid reflux

EXAM:
CHEST - 2 VIEW

[Series 1: dg chest 2 view · 0.14mm/px · 2 of 2 slices shown]
[im 1/2]
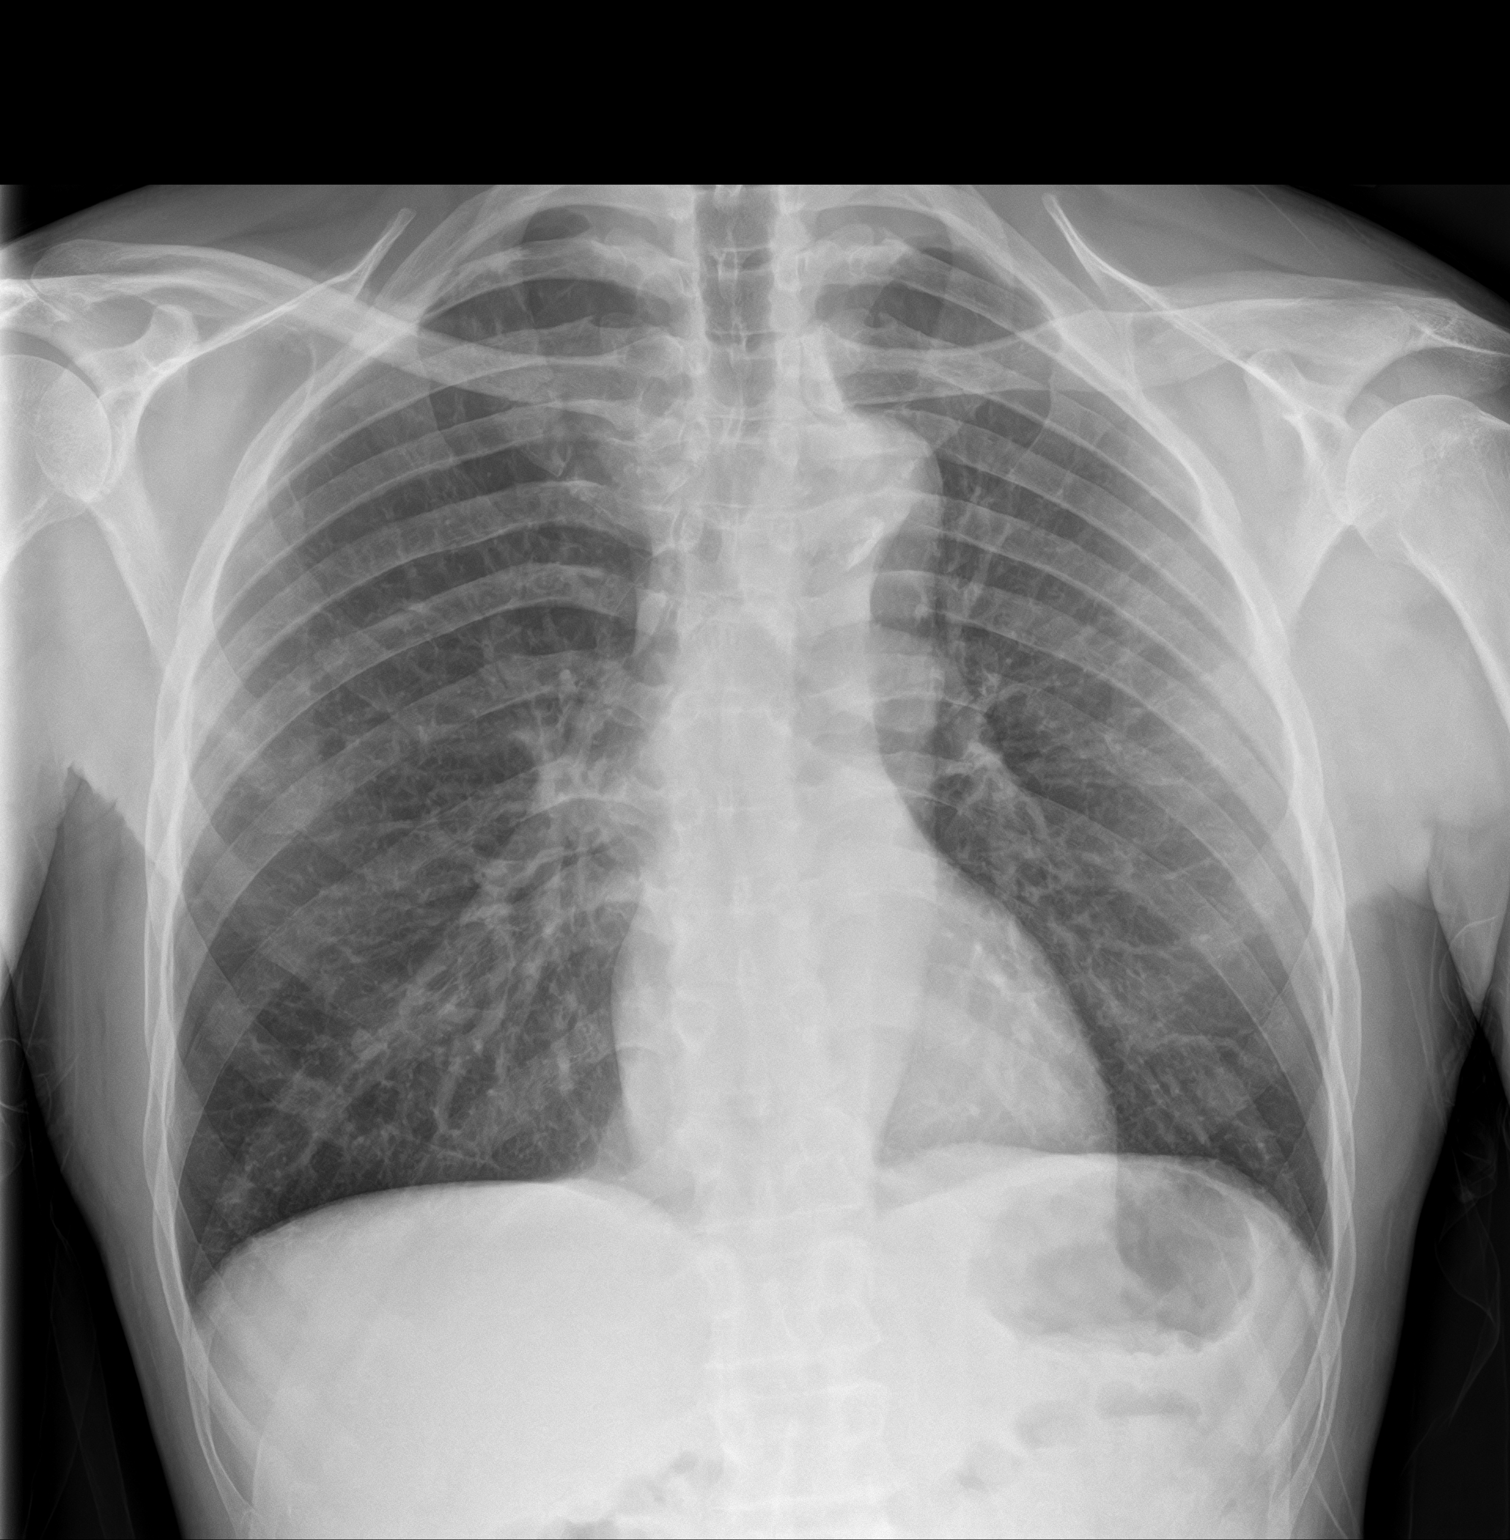
[im 2/2]
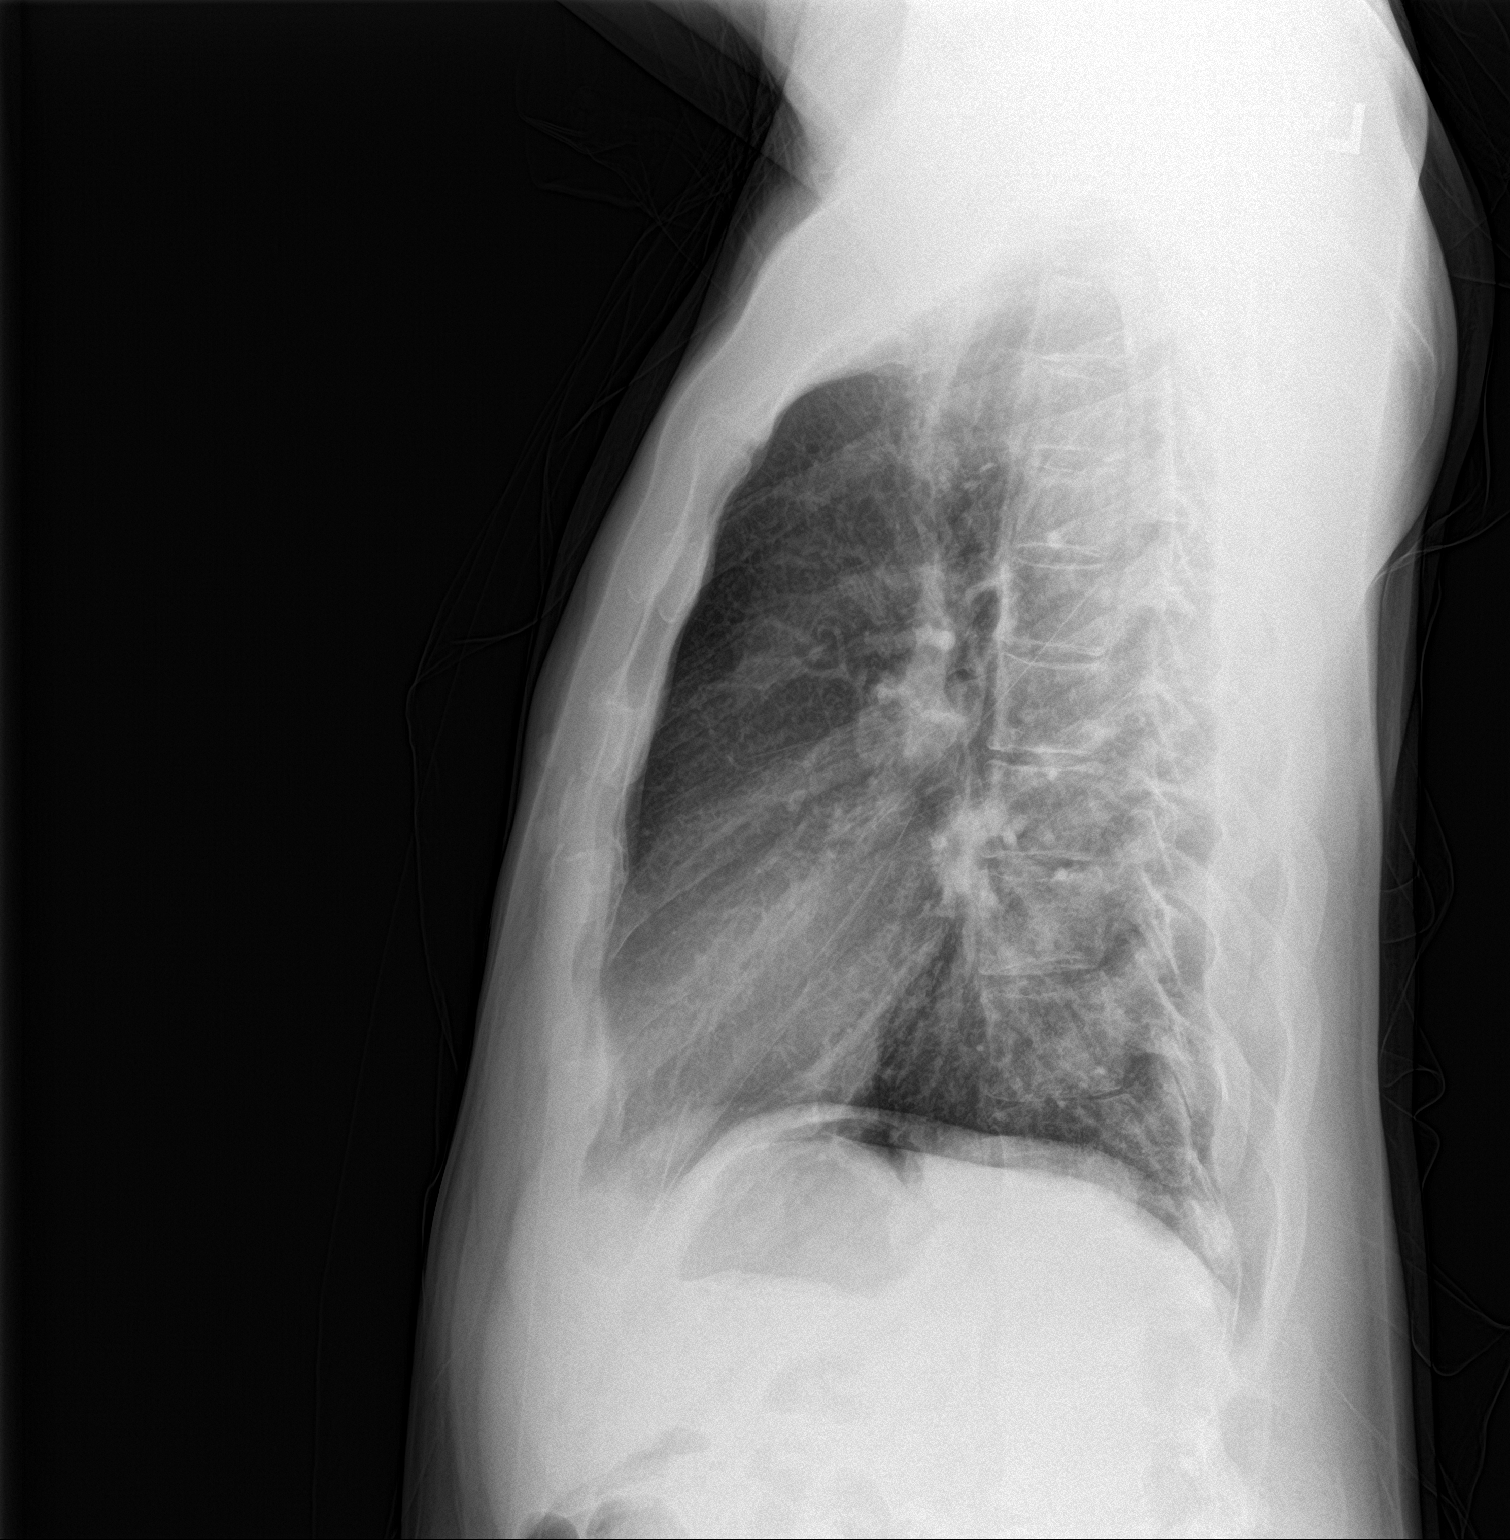

[2 of 2 positions shown; findings below may reference images not displayed]

FINDINGS: Frontal and lateral views of the chest demonstrate an unremarkable
cardiac silhouette. No airspace disease, effusion, or pneumothorax.
Stable parenchymal scarring. No acute bony abnormalities.
IMPRESSION: 1. Stable exam, no acute process.

## 2022-01-21 ENCOUNTER — Other Ambulatory Visit: Payer: Self-pay | Admitting: Internal Medicine

## 2022-02-03 ENCOUNTER — Encounter: Payer: Self-pay | Admitting: Internal Medicine

## 2022-02-03 ENCOUNTER — Ambulatory Visit: Payer: Medicaid Other | Attending: Internal Medicine | Admitting: Internal Medicine

## 2022-02-03 VITALS — BP 170/80 | HR 98 | Ht 73.0 in | Wt 146.0 lb

## 2022-02-03 DIAGNOSIS — I10 Essential (primary) hypertension: Secondary | ICD-10-CM | POA: Diagnosis not present

## 2022-02-03 NOTE — Patient Instructions (Addendum)
Medication Instructions:  Your Physician recommend you continue on your current medication as directed.    *If you need a refill on your cardiac medications before your next appointment, please call your pharmacy*   Lab Work: None ordered today   Testing/Procedures: None ordered today   Follow-Up: At [facility], you and your health needs are our priority.  As part of our continuing mission to provide you with exceptional heart care, we have created designated Provider Care Teams.  These Care Teams include your primary Cardiologist (physician) and Advanced Practice Providers (APPs -  Physician Assistants and Nurse Practitioners) who all work together to provide you with the care you need, when you need it.  We recommend signing up for the patient portal called "MyChart".  Sign up information is provided on this After Visit Summary.  MyChart is used to connect with patients for Virtual Visits (Telemedicine).  Patients are able to view lab/test results, encounter notes, upcoming appointments, etc.  Non-urgent messages can be sent to your provider as well.   To learn more about what you can do with MyChart, go to [email or web address].    Your next appointment:   3 month(s)  The format for your next appointment:   In Person  Provider:   You may see [name], MD or one of the following Advanced Practice Providers on your designated Care Team:   Nicolasa Ducking, NP Eula Listen, PA-C Cadence Fransico Michael, PA-C Charlsie Quest, NP     Please follow up with primary care physican  Dr. Okey Dupre would like you to check your blood pressure once a week and keep a journal.  It is best to check your BP 1-2 hours after taking your medications to see the medications effectiveness on your BP.    Here are some tips that our clinical pharmacists share for home BP monitoring:          Rest 10 minutes before taking your blood pressure.          Don't smoke or drink caffeinated beverages  for at least 30 minutes before.          Take your blood pressure before (not after) you eat.          Sit comfortably with your back supported and both feet on the floor (don't cross your legs).          Elevate your arm to heart level on a table or a desk.          Use the proper sized cuff. It should fit smoothly and snugly around your bare upper arm. There should be enough room to slip a fingertip under the cuff. The bottom edge of the cuff should be 1 inch above the crease of the elbow.

## 2022-02-03 NOTE — Progress Notes (Signed)
Follow-up Outpatient Visit Date: 02/03/2022  Primary Care Provider: Patient, No Pcp Per No address on file  Chief Complaint: Follow-up hypertension  HPI:  Shane Dominguez is a 61 y.o. male with history of hypertension, hepatitis C, and GERD, who presents for follow-up of hypertension.  I last saw him in May, at which time he was doing well.  He had cut down on his smoking and was also exercising more.  He remained compliant with amlodipine and was asymptomatic.  Blood pressure was well-controlled at that time; we did not make any medication changes or pursue additional testing.  Today, Shane Dominguez reports that he has been feeling well.  He has not been checking his blood pressure recently at home but thinks it is elevated readings today are due to having smoked this morning.  He unfortunately has returned to smoking a few cigarettes a day but would like to stop again soon.  He is trying to minimize his sodium intake.  He is compliant with amlodipine.  He has been having some indigestion/acid reflux after eating but otherwise denies chest pain, shortness of breath, palpitations, lightheadedness, and edema.  He does not currently have a PCP but previously was seen at the [facility]  He is planning to reach out to them today to reestablish care.  --------------------------------------------------------------------------------------------------  Cardiovascular History & Procedures: Cardiovascular Problems: Hypertension Atypical chest pain   Risk Factors: Hypertension, male gender, tobacco use, and age greater than 17   Cath/PCI: None   CV Surgery: None   EP Procedures and Devices: None   Non-Invasive Evaluation(s): Pharmacologic myocardial perfusion stress test (12/20/2017): Abnormal, probably low risk myocardial perfusion stress test.  There is a small in size, severe, fixed basal and mid inferoseptal defect consistent with scar but cannot rule out artifact.  No significant  ischemia is identified.  LVEF greater than 65% with normal wall motion.  Significant extracardiac activity and diaphragmatic attenuation noted. Transthoracic echocardiogram (12/20/2017): Normal LV size and wall thickness.  LVEF 60-65% with normal wall motion and diastolic function.  Mild mitral regurgitation.  Normal RV size and function.  Recent CV Pertinent Labs: Lab Results  Component Value Date   CHOL 131 12/20/2017   HDL 35 (L) 12/20/2017   LDLCALC 85 12/20/2017   TRIG 57 12/20/2017   CHOLHDL 3.7 12/20/2017   INR 1.16 08/18/2016   K 4.5 06/19/2019   K 3.5 07/18/2013   BUN 15 06/19/2019   BUN 10 07/18/2013   CREATININE 0.66 06/19/2019   CREATININE 0.84 07/18/2013    Past medical and surgical history were reviewed and updated in EPIC.  Current Meds  Medication Sig   amLODipine (NORVASC) 10 MG tablet Take 1 tablet by mouth once daily   Multiple Vitamin (MULTIVITAMIN) tablet Take 1 tablet by mouth daily.     Allergies: Patient has no known allergies.  Social History   Tobacco Use   Smoking status: Every Day    Packs/day: 0.50    Years: 40.00    Total pack years: 20.00    Types: Cigarettes   Smokeless tobacco: Never   Tobacco comments:    10/21/2020 Quit smoking 30 days ago       04/28/2021 1/2 PPD  Vaping Use   Vaping Use: Never used  Substance Use Topics   Alcohol use: Not Currently   Drug use: No    Family History  Problem Relation Age of Onset   Stroke Mother    COPD Father    Emphysema Father  Heart attack Brother    Stroke Brother     Review of Systems: A 12-system review of systems was performed and was negative except as noted in the HPI.  --------------------------------------------------------------------------------------------------  Physical Exam: BP (!) 170/80 (BP Location: Left Arm, Patient Position: Sitting, Cuff Size: Normal)   Pulse 98   Ht 6\' 1"  (1.854 m)   Wt 146 lb (66.2 kg)   SpO2 99%   BMI 19.26 kg/m  BP: 166/80  General:   NAD. Neck: No JVD or HJR. Lungs: Clear to auscultation bilaterally without wheezes or crackles. Heart: Regular rate and rhythm without murmurs, rubs, or gallops. Abdomen: Soft, nontender, nondistended. Extremities: No lower extremity edema.  EKG: Normal sinus rhythm without abnormalities.  Lab Results  Component Value Date   WBC 9.5 06/19/2019   HGB 16.0 06/19/2019   HCT 45.4 06/19/2019   MCV 96.2 06/19/2019   PLT 250 06/19/2019    Lab Results  Component Value Date   NA 136 06/19/2019   K 4.5 06/19/2019   CL 99 06/19/2019   CO2 30 06/19/2019   BUN 15 06/19/2019   CREATININE 0.66 06/19/2019   GLUCOSE 131 (H) 06/19/2019   ALT 22 08/18/2016    Lab Results  Component Value Date   CHOL 131 12/20/2017   HDL 35 (L) 12/20/2017   LDLCALC 85 12/20/2017   TRIG 57 12/20/2017   CHOLHDL 3.7 12/20/2017    --------------------------------------------------------------------------------------------------  ASSESSMENT AND PLAN: Hypertension: Blood pressure poorly controlled today despite Shane Dominguez being compliant with amlodipine.  He attributes some of this to smoking, though I think his elevated blood pressure is out of proportion to smoking-related changes alone.  I reinforced importance of tobacco cessation and sodium restriction.  I have recommended adding a second agent, though Shane Dominguez declines.  I have asked him to monitor his blood pressure at home as well as to establish care at the [facility] again for ongoing follow-up of his hypertension as well as for other routine health maintenance.  I have recommended obtaining routine labs given his hypertension, including BMP and lipid panel.  He wishes to defer any blood work today.  Follow-up: Return to clinic in 3 months.  [place] [facility] [place], MD 02/03/2022 9:39 AM

## 2022-02-04 ENCOUNTER — Encounter: Payer: Self-pay | Admitting: Internal Medicine

## 2022-05-10 ENCOUNTER — Other Ambulatory Visit: Payer: Self-pay | Admitting: Internal Medicine

## 2022-05-13 ENCOUNTER — Encounter: Payer: Self-pay | Admitting: Internal Medicine

## 2022-05-13 ENCOUNTER — Ambulatory Visit: Payer: Medicaid Other | Attending: Internal Medicine | Admitting: Internal Medicine

## 2022-05-13 VITALS — BP 166/88 | HR 84 | Ht 73.0 in | Wt 149.0 lb

## 2022-05-13 DIAGNOSIS — I10 Essential (primary) hypertension: Secondary | ICD-10-CM | POA: Diagnosis not present

## 2022-05-13 DIAGNOSIS — Z79899 Other long term (current) drug therapy: Secondary | ICD-10-CM

## 2022-05-13 MED ORDER — HYDROCHLOROTHIAZIDE 12.5 MG PO CAPS: 12.5000 mg | ORAL_CAPSULE | Freq: Every day | ORAL | 3 refills | Status: DC

## 2022-05-13 MED ORDER — AMLODIPINE BESYLATE 10 MG PO TABS: 10.0000 mg | ORAL_TABLET | Freq: Every day | ORAL | 3 refills | Status: DC

## 2022-05-13 NOTE — Patient Instructions (Addendum)
Medication Instructions:  Your physician recommends the following medication changes.  START TAKING: Hydrochlorothiazide 12.5 mg by mouth daily (Don't start until you have labs drawn)    *If you need a refill on your cardiac medications before your next appointment, please call your pharmacy*   Lab Work: Your provider would like for you to have the following labs drawn (BMP) as soon as possible and then in 1 month (BMP).   Please go to the [facility] entrance and check in at the front desk.  You do not need an appointment.  They are open from 7am-6 pm.  You will not need to be fasting.    Testing/Procedures: None ordered today   Follow-Up: At [facility], you and your health needs are our priority.  As part of our continuing mission to provide you with exceptional heart care, we have created designated Provider Care Teams.  These Care Teams include your primary Cardiologist (physician) and Advanced Practice Providers (APPs -  Physician Assistants and Nurse Practitioners) who all work together to provide you with the care you need, when you need it.  We recommend signing up for the patient portal called "MyChart".  Sign up information is provided on this After Visit Summary.  MyChart is used to connect with patients for Virtual Visits (Telemedicine).  Patients are able to view lab/test results, encounter notes, upcoming appointments, etc.  Non-urgent messages can be sent to your provider as well.   To learn more about what you can do with MyChart, go to [email or web address].    Your next appointment:   2 month(s)  Provider:   You may see Nelva Bush, MD or one of the following Advanced Practice Providers on your designated Care Team:   Murray Hodgkins, NP Christell Faith, PA-C Cadence Kathlen Mody, PA-C Gerrie Nordmann, NP

## 2022-05-13 NOTE — Progress Notes (Unsigned)
Follow-up Outpatient Visit Date: 05/13/2022  Primary Care Provider: Patient, No Pcp Per No address on file  Chief Complaint: Follow-up hypertension  HPI:  Shane Dominguez is a 62 y.o. male with history of hypertension, hepatitis C, and GERD, who presents for follow-up of hypertension.  I last saw him in 01/2022, at which time Shane Dominguez was feeling well.  Blood pressure was noted to be quite elevated at 170/80.  He remained compliant with amlodipine and refused to add a second agent, wanting to double down on lifestyle modifications instead.  Today, Mr. Kinlock reports that he has been suffering from seasonal allergies for the last week or two.  He also ran out of amlodipine ~3 weeks ago but was able to restart 2 days ago.  He has sporadically checked his BP at home, noting a systolic BP of [id number] mmHg on last check.  He is trying to exercise daily, doing pushups and working.  He tries to minimize his sodium intake but admits to eating out at times.  He denies chest pain, shortness of breath, palpitations, lightheadedness, and edema.  He is tolerating amlodipine well.  --------------------------------------------------------------------------------------------------  Cardiovascular History & Procedures: Cardiovascular Problems: Hypertension Atypical chest pain   Risk Factors: Hypertension, male gender, tobacco use, and age greater than 66   Cath/PCI: None   CV Surgery: None   EP Procedures and Devices: None   Non-Invasive Evaluation(s): Pharmacologic myocardial perfusion stress test (12/20/2017): Abnormal, probably low risk myocardial perfusion stress test.  There is a small in size, severe, fixed basal and mid inferoseptal defect consistent with scar but cannot rule out artifact.  No significant ischemia is identified.  LVEF greater than 65% with normal wall motion.  Significant extracardiac activity and diaphragmatic attenuation noted. Transthoracic echocardiogram (12/20/2017): Normal  LV size and wall thickness.  LVEF 60-65% with normal wall motion and diastolic function.  Mild mitral regurgitation.  Normal RV size and function.  Recent CV Pertinent Labs: Lab Results  Component Value Date   CHOL 131 12/20/2017   HDL 35 (L) 12/20/2017   LDLCALC 85 12/20/2017   TRIG 57 12/20/2017   CHOLHDL 3.7 12/20/2017   INR 1.16 08/18/2016   K 4.5 06/19/2019   K 3.5 07/18/2013   BUN 15 06/19/2019   BUN 10 07/18/2013   CREATININE 0.66 06/19/2019   CREATININE 0.84 07/18/2013    Past medical and surgical history were reviewed and updated in EPIC.  Current Meds  Medication Sig   amLODipine (NORVASC) 10 MG tablet TAKE 1 TABLET BY MOUTH ONCE DAILY. MUST KEEP APPOINTMENT FOR REFILLS.   Multiple Vitamin (MULTIVITAMIN) tablet Take 1 tablet by mouth daily.     Allergies: Patient has no known allergies.  Social History   Tobacco Use   Smoking status: Every Day    Packs/day: 0.50    Years: 40.00    Total pack years: 20.00    Types: Cigarettes   Smokeless tobacco: Never   Tobacco comments:    10/21/2020 Quit smoking 30 days ago       04/28/2021 1/2 PPD  Vaping Use   Vaping Use: Never used  Substance Use Topics   Alcohol use: Not Currently   Drug use: No    Family History  Problem Relation Age of Onset   Stroke Mother    COPD Father    Emphysema Father    Heart attack Brother    Stroke Brother     Review of Systems: A 12-system review of systems was performed and  was negative except as noted in the HPI.  --------------------------------------------------------------------------------------------------  Physical Exam: BP (!) 168/90 (BP Location: Left Arm, Patient Position: Sitting, Cuff Size: Normal)   Pulse 84   Ht '6\' 1"'$  (1.854 m)   Wt 149 lb (67.6 kg)   SpO2 99%   BMI 19.66 kg/m  Repeat BP: 166/88  General:  NAD. Neck: No JVD or HJR. Lungs: Clear to auscultation bilaterally without wheezes or crackles. Heart: Regular rate and rhythm without murmurs,  rubs, or gallops. Abdomen: Soft, nontender, nondistended. Extremities: No lower extremity edema.  Lab Results  Component Value Date   WBC 9.5 06/19/2019   HGB 16.0 06/19/2019   HCT 45.4 06/19/2019   MCV 96.2 06/19/2019   PLT 250 06/19/2019    Lab Results  Component Value Date   NA 136 06/19/2019   K 4.5 06/19/2019   CL 99 06/19/2019   CO2 30 06/19/2019   BUN 15 06/19/2019   CREATININE 0.66 06/19/2019   GLUCOSE 131 (H) 06/19/2019   ALT 22 08/18/2016    Lab Results  Component Value Date   CHOL 131 12/20/2017   HDL 35 (L) 12/20/2017   LDLCALC 85 12/20/2017   TRIG 57 12/20/2017   CHOLHDL 3.7 12/20/2017    --------------------------------------------------------------------------------------------------  ASSESSMENT AND PLAN: Uncontrolled hypertension: BP again suboptimally controlled today, confounded by having recently run out of amlodipine and some dietary indiscretion.  He has agreed to add HCTZ 12.5 mg daily.  We will need to check a BMP before starting HCTZ (he wishes to return next week for labs) and in about a month after starting therapy.  He should continue amlodipine 10 mg daily as well as sodium restriction.  Follow-up: Return to clinic in 2 months.  Nelva Bush, MD 05/13/2022 9:31 AM

## 2022-05-14 ENCOUNTER — Encounter: Payer: Self-pay | Admitting: Internal Medicine

## 2022-05-16 ENCOUNTER — Other Ambulatory Visit
Admission: RE | Admit: 2022-05-16 | Discharge: 2022-05-16 | Disposition: A | Discharge status: Home / Self Care | Payer: Medicaid Other | Attending: Internal Medicine | Admitting: Internal Medicine

## 2022-05-16 DIAGNOSIS — Z79899 Other long term (current) drug therapy: Secondary | ICD-10-CM | POA: Insufficient documentation

## 2022-05-16 LAB — BASIC METABOLIC PANEL
Anion gap: 7 (ref 5–15)
BUN: 14 mg/dL (ref 8–23)
CO2: 28 mmol/L (ref 22–32)
Calcium: 9 mg/dL (ref 8.9–10.3)
Chloride: 103 mmol/L (ref 98–111)
Creatinine, Ser: 0.84 mg/dL (ref 0.61–1.24)
GFR, Estimated: >60 mL/min (ref >60)
Glucose, Bld: 115 mg/dL — ABNORMAL HIGH (ref 70–99)
Potassium: 3.7 mmol/L (ref 3.5–5.1)
Sodium: 138 mmol/L (ref 135–145)

## 2022-07-12 NOTE — Progress Notes (Unsigned)
Cardiology Office Note    Date:  07/13/2022   ID:  CARDAE SAPP, DOB Feb 10, 1961, MRN [id number]  PCP:  Patient, No Pcp Per  Cardiologist:  Shane Kendall, MD  Electrophysiologist:  None   Chief Complaint: Follow-up  History of Present Illness:   Shane Dominguez is a 62 y.o. male with history of HTN, hepatitis C, tobacco use, and GERD who presents for follow-up of HTN.  Lexiscan MPI in 12/2017 showed a small in size, severe, fixed basal and mid inferior septal defect consistent with scar, though could not rule out artifact.  There was no significant ischemia.  LVEF greater than 65% with normal wall motion.  This was felt to be low risk.  Echo in 12/2017 showed an EF of 60 to 65%, normal wall motion, normal LV diastolic function parameters, mild mitral regurgitation, and normal RV systolic function and ventricular cavity size.  He has been seen over the years for uncontrolled hypertension in the setting of nonadherence to medications and excessive sodium intake.  He was most recently seen in the office in 05/2022, at which time he was struggling with seasonal allergies and had been out of amlodipine for approximately three weeks, though had been able to restart the medication two days prior to his visit.  He was sporadically checking BP at home noting a systolic blood pressure 140 mmHg on last check.  He was trying to exercise and minimize sodium intake, though did eat out at times.  He was started on HCTZ 12.5 mg daily (after stable labs confirmed) with continuation of amlodipine 10 mg.  He comes in doing well and is without symptoms concerning for angina or cardiac decompensation.  No dyspnea, palpitations, dizziness, presyncope, or syncope.  He has not yet started hydrochlorothiazide, though does report picking this medication up and having it at home.  He reports adherence to amlodipine 10 mg daily.  He does try and watch his sodium intake, though does continue to eat out at restaurants at  times.  No lower extremity swelling, orthopnea, or early satiety.   Labs independently reviewed: 05/2022 - potassium 3.7, BUN 14, serum creatinine 0.84 06/2019 - Hgb 16.0, PLT 250 12/2017 - TC 131, TG 57, HDL 35, LDL 85 08/2016 - albumin 4.1, AST/ALT normal 04/2013 - TSH normal  Past Medical History:  Diagnosis Date   Acute appendicitis    Appendicitis 08/18/2016   GERD (gastroesophageal reflux disease)    NO MEDS   Hepatitis C    Hypertension    Tobacco abuse     Past Surgical History:  Procedure Laterality Date   HERNIA REPAIR  11/18/2016   INGUINAL HERNIA REPAIR Bilateral 11/17/2016   Procedure: LAPAROSCOPIC BILATERAL INGUINAL HERNIA REPAIR;  Surgeon: Shane Ro, MD;  Location: [facility] ORS;  Service: General;  Laterality: Bilateral;   INSERTION OF MESH Bilateral 11/17/2016   Procedure: INSERTION OF MESH;  Surgeon: Shane Ro, MD;  Location: [facility] ORS;  Service: General;  Laterality: Bilateral;   LAPAROSCOPIC APPENDECTOMY N/A 08/18/2016   Procedure: APPENDECTOMY LAPAROSCOPIC;  Surgeon: Shane Ro, MD;  Location: [facility] ORS;  Service: General;  Laterality: N/A;    Current Medications: Current Meds  Medication Sig   amLODipine (NORVASC) 10 MG tablet Take 1 tablet (10 mg total) by mouth daily.   hydrochlorothiazide (MICROZIDE) 12.5 MG capsule Take 1 capsule (12.5 mg total) by mouth daily.   Multiple Vitamin (MULTIVITAMIN) tablet Take 1 tablet by mouth daily.     Allergies:  Patient has no known allergies.   Social History   Socioeconomic History   Marital status: Single    Spouse name: Not on file   Number of children: Not on file   Years of education: Not on file   Highest education level: Not on file  Occupational History   Not on file  Tobacco Use   Smoking status: Every Day    Packs/day: 0.50    Years: 40.00    Additional pack years: 0.00    Total pack years: 20.00    Types: Cigarettes   Smokeless tobacco: Never   Tobacco comments:    10/21/2020 Quit  smoking 30 days ago       04/28/2021 1/2 PPD  Vaping Use   Vaping Use: Never used  Substance and Sexual Activity   Alcohol use: Not Currently   Drug use: No   Sexual activity: Not on file  Other Topics Concern   Not on file  Social History Narrative   Not on file   Social Determinants of Health   Financial Resource Strain: Not on file  Food Insecurity: Not on file  Transportation Needs: Not on file  Physical Activity: Not on file  Stress: Not on file  Social Connections: Not on file     Family History:  The patient's family history includes COPD in his father; Emphysema in his father; Heart attack in his brother; Stroke in his brother and mother.  ROS:   12-point review of systems is negative unless otherwise noted in the HPI.   EKGs/Labs/Other Studies Reviewed:    Studies reviewed were summarized above. The additional studies were reviewed today:  2D echo 12/20/2017: - Left ventricle: The cavity size was normal. Wall thickness was    normal. Systolic function was normal. The estimated ejection    fraction was in the range of 60% to 65%. Wall motion was normal;    there were no regional wall motion abnormalities. Left    ventricular diastolic function parameters were normal.  - Mitral valve: There was mild regurgitation.  - Right ventricle: The cavity size was normal. Wall thickness was    normal. Systolic function was normal.  __________  Shane Dominguez MPI 12/20/2017: Abnormal, probably low risk myocardial perfusion stress test There is a small in size, severe, fixed basal and mid inferoseptal defect consistent with scar but cannot rule out artifact. There is no significant ischemia. The left ventricular ejection fraction is normal(>65%) with normal regional wall motion. The sensitivity and specificity of the study are degraded by extracardiac activity and diaphragmatic attenuation.    EKG:  EKG is ordered today.  The EKG ordered today demonstrates NSR, 70 bpm, no  acute ST-T changes  Recent Labs: 05/16/2022: BUN 14; Creatinine, Ser 0.84; Potassium 3.7; Sodium 138  Recent Lipid Panel    Component Value Date/Time   CHOL 131 12/20/2017 0531   TRIG 57 12/20/2017 0531   HDL 35 (L) 12/20/2017 0531   CHOLHDL 3.7 12/20/2017 0531   VLDL 11 12/20/2017 0531   LDLCALC 85 12/20/2017 0531    PHYSICAL EXAM:    VS:  BP (!) 148/78 (BP Location: Right Arm, Patient Position: Sitting, Cuff Size: Normal)   Pulse 78   Ht 6\' 1"  (1.854 m)   Wt 142 lb (64.4 kg)   SpO2 99%   BMI 18.73 kg/m   BMI: Body mass index is 18.73 kg/m.  Physical Exam Vitals reviewed.  Constitutional:      Appearance: He is well-developed.  HENT:  Head: Normocephalic and atraumatic.  Eyes:     General:        Right eye: No discharge.        Left eye: No discharge.  Neck:     Vascular: No JVD.  Cardiovascular:     Rate and Rhythm: Normal rate and regular rhythm.     Pulses:          Posterior tibial pulses are 2+ on the right side and 2+ on the left side.     Heart sounds: Normal heart sounds, S1 normal and S2 normal. Heart sounds not distant. No midsystolic click and no opening snap. No murmur heard.    No friction rub.  Pulmonary:     Effort: Pulmonary effort is normal. No respiratory distress.     Breath sounds: Normal breath sounds. No decreased breath sounds, wheezing or rales.  Chest:     Chest wall: No tenderness.  Abdominal:     General: There is no distension.  Musculoskeletal:     Cervical back: Normal range of motion.     Right lower leg: No edema.     Left lower leg: No edema.  Skin:    General: Skin is warm and dry.     Nails: There is no clubbing.  Neurological:     Mental Status: He is alert and oriented to person, place, and time.  Psychiatric:        Speech: Speech normal.        Behavior: Behavior normal.        Thought Content: Thought content normal.        Judgment: Judgment normal.     Wt Readings from Last 3 Encounters:  07/13/22 142 lb  (64.4 kg)  05/13/22 149 lb (67.6 kg)  02/03/22 146 lb (66.2 kg)     ASSESSMENT & PLAN:   HTN: Blood pressure remains elevated, though is improved.  He has not yet started hydrochlorothiazide.  Remains on amlodipine 10 mg daily.  Start hydrochlorothiazide 12.5 mg daily.  Follow-up BMP in 1 to 2 weeks to ensure stable renal function and electrolytes on thiazide diuretic.  Continue sodium restriction.   Disposition: F/u with Dr. Okey Dupre or an APP in 2 months.   Medication Adjustments/Labs and Tests Ordered: Current medicines are reviewed at length with the patient today.  Concerns regarding medicines are outlined above. Medication changes, Labs and Tests ordered today are summarized above and listed in the Patient Instructions accessible in Encounters.   Signed, Eula Listen, PA-C 07/13/2022 9:53 AM     [facility] [place] [address] [place], [place] [address] [phone]

## 2022-07-13 ENCOUNTER — Encounter: Payer: Self-pay | Admitting: Physician Assistant

## 2022-07-13 ENCOUNTER — Ambulatory Visit: Payer: Medicaid Other | Attending: Physician Assistant | Admitting: Physician Assistant

## 2022-07-13 VITALS — BP 148/78 | HR 78 | Ht 73.0 in | Wt 142.0 lb

## 2022-07-13 DIAGNOSIS — I10 Essential (primary) hypertension: Secondary | ICD-10-CM | POA: Diagnosis not present

## 2022-07-13 DIAGNOSIS — Z79899 Other long term (current) drug therapy: Secondary | ICD-10-CM

## 2022-07-13 NOTE — Patient Instructions (Signed)
Medication Instructions:  Your physician recommends that you continue on your current medications as directed. Please refer to the Current Medication list given to you today.  *If you need a refill on your cardiac medications before your next appointment, please call your pharmacy*   Lab Work: Your physician recommends that you return for lab work in 2 weeks: BMP  Medical Mall Entrance at [facility] 1st desk on the right to check in (REGISTRATION)  Lab hours: Monday- Friday (7:30 am- 5:30 pm)   If you have labs (blood work) drawn today and your tests are completely normal, you will receive your results only by: MyChart Message (if you have MyChart) OR A paper copy in the mail If you have any lab test that is abnormal or we need to change your treatment, we will call you to review the results.   Testing/Procedures: -None ordered   Follow-Up: At [facility], you and your health needs are our priority.  As part of our continuing mission to provide you with exceptional heart care, we have created designated Provider Care Teams.  These Care Teams include your primary Cardiologist (physician) and Advanced Practice Providers (APPs -  Physician Assistants and Nurse Practitioners) who all work together to provide you with the care you need, when you need it.  We recommend signing up for the patient portal called "MyChart".  Sign up information is provided on this After Visit Summary.  MyChart is used to connect with patients for Virtual Visits (Telemedicine).  Patients are able to view lab/test results, encounter notes, upcoming appointments, etc.  Non-urgent messages can be sent to your provider as well.   To learn more about what you can do with MyChart, go to [email or web address].    Your next appointment:   3 month(s)  Provider:   Eula Listen, PA-C    Other Instructions -None

## 2022-10-17 NOTE — Progress Notes (Unsigned)
Cardiology Office Note    Date:  10/18/2022   ID:  Shane Dominguez, DOB 08-21-1960, MRN [id number]  PCP:  Patient, No Pcp Per  Cardiologist:  [name], MD  Electrophysiologist:  None   Chief Complaint: Follow up  History of Present Illness:   Shane Dominguez is a 61 y.o. male with history of HTN, hepatitis C, tobacco use, and GERD who presents for follow-up of HTN.   Lexiscan MPI in 12/2017 showed a small in size, severe, fixed basal and mid inferior septal defect consistent with scar, though could not rule out artifact.  There was no significant ischemia.  LVEF greater than 65% with normal wall motion.  This was felt to be low risk.  Echo in 12/2017 showed an EF of 60 to 65%, normal wall motion, normal LV diastolic function parameters, mild mitral regurgitation, and normal RV systolic function and ventricular cavity size.  He has been seen over the years for uncontrolled hypertension in the setting of nonadherence to medications and excessive sodium intake.  He was seen in the office in 05/2022, at which time he was struggling with seasonal allergies and had been out of amlodipine for approximately three weeks, though had been able to restart the medication two days prior to his visit.  He was sporadically checking BP at home noting a systolic blood pressure 140 mmHg on last check.  He was trying to exercise and minimize sodium intake, though did eat out at times.  He was started on HCTZ 12.5 mg daily (after stable labs confirmed) with continuation of amlodipine 10 mg.  He was last seen in the office on 07/13/2022 and was without symptoms of angina or cardiac decompensation.  He had not yet started HCTZ.  He reported adherence to amlodipine 10 mg.  Blood pressure 148/78 in the office.  It was recommended he start previously recommended HCTZ 12.5 mg daily with a follow-up BMP 1 to 2 weeks thereafter, which remains pending.  He comes in doing well from a cardiac perspective and is without  symptoms of angina or cardiac decompensation.  No dyspnea, palpitations, dizziness, presyncope, or syncope.  No falls or symptoms concerning for bleeding.  No lower extremity swelling, orthopnea, or early satiety.  Not checking blood pressure at home.  He has not yet started HCTZ.  Reports adherence to amlodipine 10 mg on a daily basis.  Continues to smoke approximately 1 pack/day and eats out at [employer] on a regular basis.  Reports he is going to undergo significant lifestyle modification with complete smoking cessation and begin cooking at home.   Labs independently reviewed: 05/2022 - potassium 3.7, BUN 14, serum creatinine 0.84 06/2019 - Hgb 16.0, PLT 250 12/2017 - TC 131, TG 57, HDL 35, LDL 85 08/2016 - albumin 4.1, AST/ALT normal 04/2013 - TSH normal  Past Medical History:  Diagnosis Date   Acute appendicitis    Appendicitis 08/18/2016   GERD (gastroesophageal reflux disease)    NO MEDS   Hepatitis C    Hypertension    Tobacco abuse     Past Surgical History:  Procedure Laterality Date   HERNIA REPAIR  11/18/2016   INGUINAL HERNIA REPAIR Bilateral 11/17/2016   Procedure: LAPAROSCOPIC BILATERAL INGUINAL HERNIA REPAIR;  Surgeon: Leafy Ro, MD;  Location: [facility] ORS;  Service: General;  Laterality: Bilateral;   INSERTION OF MESH Bilateral 11/17/2016   Procedure: INSERTION OF MESH;  Surgeon: Leafy Ro, MD;  Location: [facility] OR[facility];  Service: General;  Laterality: Bilateral;   LAPAROSCOPIC APPENDECTOMY N/A 08/18/2016   Procedure: APPENDECTOMY LAPAROSCOPIC;  Surgeon: Leafy Ro, MD;  Location: [facility] OR[facility];  Service: General;  Laterality: N/A;    Current Medications: Current Meds  Medication Sig   amLODipine (NORVASC) 10 MG tablet Take 1 tablet (10 mg total) by mouth daily.   Multiple Vitamin (MULTIVITAMIN) tablet Take 1 tablet by mouth daily.    [DISCONTINUED] hydrochlorothiazide (MICROZIDE) 12.5 MG capsule Take 1 capsule (12.5 mg total) by mouth daily.     Allergies:   Patient has no known allergies.   Social History   Socioeconomic History   Marital status: Single    Spouse name: Not on file   Number of children: Not on file   Years of education: Not on file   Highest education level: Not on file  Occupational History   Not on file  Tobacco Use   Smoking status: Every Day    Current packs/day: 0.50    Average packs/day: 0.5 packs/day for 40.0 years (20.0 ttl pk-yrs)    Types: Cigarettes   Smokeless tobacco: Never   Tobacco comments:    10/21/2020 Quit smoking 30 days ago       04/28/2021 1/2 PPD  Vaping Use   Vaping status: Never Used  Substance and Sexual Activity   Alcohol use: Not Currently   Drug use: No   Sexual activity: Not on file  Other Topics Concern   Not on file  Social History Narrative   Not on file   Social Determinants of Health   Financial Resource Strain: Not on file  Food Insecurity: Not on file  Transportation Needs: Not on file  Physical Activity: Not on file  Stress: Not on file  Social Connections: Not on file     Family History:  The patient's family history includes COPD in his father; Emphysema in his father; Heart attack in his brother; Stroke in his brother and mother.  ROS:   12-point review of systems is negative unless otherwise noted in the HPI.   EKGs/Labs/Other Studies Reviewed:    Studies reviewed were summarized above. The additional studies were reviewed today:  2D echo 12/20/2017: - Left ventricle: The cavity size was normal. Wall thickness was    normal. Systolic function was normal. The estimated ejection    fraction was in the range of 60% to 65%. Wall motion was normal;    there were no regional wall motion abnormalities. Left    ventricular diastolic function parameters were normal.  - Mitral valve: There was mild regurgitation.  - Right ventricle: The cavity size was normal. Wall thickness was    normal. Systolic function was normal.  __________   Eugenie Birks  MPI 12/20/2017: Abnormal, probably low risk myocardial perfusion stress test There is a small in size, severe, fixed basal and mid inferoseptal defect consistent with scar but cannot rule out artifact. There is no significant ischemia. The left ventricular ejection fraction is normal(>65%) with normal regional wall motion. The sensitivity and specificity of the study are degraded by extracardiac activity and diaphragmatic attenuation.   EKG:  EKG is not ordered today.   Recent Labs: 05/16/2022: BUN 14; Creatinine, Ser 0.84; Potassium 3.7; Sodium 138  Recent Lipid Panel    Component Value Date/Time   CHOL 131 12/20/2017 0531   TRIG 57 12/20/2017 0531   HDL 35 (L) 12/20/2017 0531   CHOLHDL 3.7 12/20/2017 0531   VLDL 11 12/20/2017 0531   LDLCALC 85 12/20/2017 0531  PHYSICAL EXAM:    VS:  BP (!) 160/92 (BP Location: Left Arm, Patient Position: Sitting, Cuff Size: Normal)   Pulse 79   Ht 6\' 1"  (1.854 m)   Wt 142 lb 9.6 oz (64.7 kg)   SpO2 99%   BMI 18.81 kg/m   BMI: Body mass index is 18.81 kg/m.  Physical Exam Vitals reviewed.  Constitutional:      Appearance: He is well-developed.  HENT:     Head: Normocephalic and atraumatic.  Eyes:     General:        Right eye: No discharge.        Left eye: No discharge.  Neck:     Vascular: No JVD.  Cardiovascular:     Rate and Rhythm: Normal rate and regular rhythm.     Pulses:          Posterior tibial pulses are 2+ on the right side and 2+ on the left side.     Heart sounds: Normal heart sounds, S1 normal and S2 normal. Heart sounds not distant. No midsystolic click and no opening snap. No murmur heard.    No friction rub.  Pulmonary:     Effort: Pulmonary effort is normal. No respiratory distress.     Breath sounds: Normal breath sounds. No decreased breath sounds, wheezing or rales.  Chest:     Chest wall: No tenderness.  Abdominal:     General: There is no distension.  Musculoskeletal:     Cervical back: Normal  range of motion.     Right lower leg: No edema.     Left lower leg: No edema.  Skin:    General: Skin is warm and dry.     Nails: There is no clubbing.  Neurological:     Mental Status: He is alert and oriented to person, place, and time.  Psychiatric:        Speech: Speech normal.        Behavior: Behavior normal.        Thought Content: Thought content normal.        Judgment: Judgment normal.     Wt Readings from Last 3 Encounters:  10/18/22 142 lb 9.6 oz (64.7 kg)  07/13/22 142 lb (64.4 kg)  05/13/22 149 lb (67.6 kg)     ASSESSMENT & PLAN:   HTN: Blood pressure remains above goal.  He has not yet started previously recommended hydrochlorothiazide.  He does report adherence to amlodipine, which will be continued at 10 mg daily.  Reports he will undergo complete smoking cessation and decrease in sodium intake at home.  Declines initiation of HCTZ or carvedilol at this time, indicating he wants to work on lifestyle modification initially and reserve escalation of antihypertensive therapy for persistently elevated BP despite smoking cessation and decreased sodium intake.   Disposition: F/u with Dr. Okey Dupre or an APP in 4 months.   Medication Adjustments/Labs and Tests Ordered: Current medicines are reviewed at length with the patient today.  Concerns regarding medicines are outlined above. Medication changes, Labs and Tests ordered today are summarized above and listed in the Patient Instructions accessible in Encounters.   Signed, Eula Listen, PA-C 10/18/2022 9:46 AM     [facility] [place] [address] [place], [place] [address] [phone]

## 2022-10-18 ENCOUNTER — Ambulatory Visit: Payer: Medicaid Other | Attending: Physician Assistant | Admitting: Physician Assistant

## 2022-10-18 ENCOUNTER — Encounter: Payer: Self-pay | Admitting: Physician Assistant

## 2022-10-18 VITALS — BP 160/92 | HR 79 | Ht 73.0 in | Wt 142.6 lb

## 2022-10-18 DIAGNOSIS — I10 Essential (primary) hypertension: Secondary | ICD-10-CM | POA: Diagnosis not present

## 2022-10-18 NOTE — Patient Instructions (Signed)
Medication Instructions:   Your physician recommends the following medication changes.  STOP TAKING: Hydrochlorothiazide (HCTZ)    *If you need a refill on your cardiac medications before your next appointment, please call your pharmacy*   Lab Work: none If you have labs (blood work) drawn today and your tests are completely normal, you will receive your results only by: MyChart Message (if you have MyChart) OR A paper copy in the mail If you have any lab test that is abnormal or we need to change your treatment, we will call you to review the results.   Testing/Procedures: none   Follow-Up: At [facility], you and your health needs are our priority.  As part of our continuing mission to provide you with exceptional heart care, we have created designated Provider Care Teams.  These Care Teams include your primary Cardiologist (physician) and Advanced Practice Providers (APPs -  Physician Assistants and Nurse Practitioners) who all work together to provide you with the care you need, when you need it.  We recommend signing up for the patient portal called "MyChart".  Sign up information is provided on this After Visit Summary.  MyChart is used to connect with patients for Virtual Visits (Telemedicine).  Patients are able to view lab/test results, encounter notes, upcoming appointments, etc.  Non-urgent messages can be sent to your provider as well.   To learn more about what you can do with MyChart, go to [email or web address].    Your next appointment:   4 month(s)  Provider:   You may see [name], MD or one of the following Advanced Practice Providers on your designated Care Team:    Eula Listen, [place]

## 2022-12-08 NOTE — Congregational Nurse Program (Signed)
  Dept: [phone]   Congregational Nurse Program Note  Date of Encounter: 12/08/2022 Client to [facility] day center this morning with complaints of left eye irritation. Left eye with redness. He denied any vision changes or pain. He feels it maybe allergies. Saline eye drops placed in the left eye. Client felt relief. Client does not come regularly to the center. RN to attempt to assess for PCP needs at next visit. Francesco Runner BSN, RN Past Medical History: Past Medical History:  Diagnosis Date   Acute appendicitis    Appendicitis 08/18/2016   GERD (gastroesophageal reflux disease)    NO MEDS   Hepatitis C    Hypertension    Tobacco abuse     Encounter Details:  CNP Questionnaire - 12/08/22 0945       Questionnaire   Ask client: Do you give verbal consent for me to treat you today? Yes    Student Assistance N/A    Location Patient Served  [facility]    Visit Setting with Client Organization    Patient Status Unknown    Insurance [facility]   [facility]   Insurance/Financial Assistance Referral N/A    Medication N/A    Medical Provider No    Screening Referrals Made N/A    Medical Referrals Made N/A    Medical Appointment Made N/A    Recently w/o PCP, now 1st time PCP visit completed due to CNs referral or appointment made N/A    Food Have Food Insecurities    Transportation N/A    Housing/Utilities No permanent housing    Interpersonal Safety N/A    Interventions Advocate/Support    Abnormal to Normal Screening Since Last CN Visit N/A    Screenings CN Performed N/A    Sent Client to Lab for: N/A    Did client attend any of the following based off CNs referral or appointments made? N/A    ED Visit Averted N/A    Life-Saving Intervention Made N/A

## 2023-01-23 ENCOUNTER — Ambulatory Visit: Payer: Medicaid Other | Admitting: Surgery

## 2023-01-30 ENCOUNTER — Other Ambulatory Visit: Payer: Self-pay

## 2023-01-30 ENCOUNTER — Ambulatory Visit: Payer: Medicaid Other | Admitting: Surgery

## 2023-01-30 ENCOUNTER — Emergency Department: Payer: Medicaid Other

## 2023-01-30 ENCOUNTER — Emergency Department
Admission: EM | Admit: 2023-01-30 | Discharge: 2023-01-30 | Disposition: A | Discharge status: Home / Self Care | Payer: Medicaid Other | Attending: Emergency Medicine | Admitting: Emergency Medicine

## 2023-01-30 DIAGNOSIS — I10 Essential (primary) hypertension: Secondary | ICD-10-CM | POA: Diagnosis not present

## 2023-01-30 DIAGNOSIS — R1030 Lower abdominal pain, unspecified: Secondary | ICD-10-CM

## 2023-01-30 DIAGNOSIS — K5900 Constipation, unspecified: Secondary | ICD-10-CM | POA: Insufficient documentation

## 2023-01-30 LAB — LIPASE, BLOOD: Lipase: 47 U/L (ref 11–51)

## 2023-01-30 LAB — COMPREHENSIVE METABOLIC PANEL
ALT: 15 U/L (ref 0–44)
AST: 22 U/L (ref 15–41)
Albumin: 4.4 g/dL (ref 3.5–5.0)
Alkaline Phosphatase: 59 U/L (ref 38–126)
Anion gap: 9 (ref 5–15)
BUN: 12 mg/dL (ref 8–23)
CO2: 27 mmol/L (ref 22–32)
Calcium: 8.6 mg/dL — ABNORMAL LOW (ref 8.9–10.3)
Chloride: 107 mmol/L (ref 98–111)
Creatinine, Ser: 0.72 mg/dL (ref 0.61–1.24)
GFR, Estimated: >60 mL/min (ref >60)
Glucose, Bld: 99 mg/dL (ref 70–99)
Potassium: 3.4 mmol/L — ABNORMAL LOW (ref 3.5–5.1)
Sodium: 143 mmol/L (ref 135–145)
Total Bilirubin: 0.5 mg/dL (ref <1.2)
Total Protein: 8.4 g/dL — ABNORMAL HIGH (ref 6.5–8.1)

## 2023-01-30 LAB — CBC
HCT: 43.9 % (ref 39.0–52.0)
Hemoglobin: 15.2 g/dL (ref 13.0–17.0)
MCH: 33.3 pg (ref 26.0–34.0)
MCHC: 34.6 g/dL (ref 30.0–36.0)
MCV: 96.1 fL (ref 80.0–100.0)
Platelets: 262 10*3/uL (ref 150–400)
RBC: 4.57 MIL/uL (ref 4.22–5.81)
RDW: 12.7 % (ref 11.5–15.5)
WBC: 4.4 10*3/uL (ref 4.0–10.5)
nRBC: 0 % (ref 0.0–0.2)

## 2023-01-30 LAB — URINALYSIS, ROUTINE W REFLEX MICROSCOPIC
Bilirubin Urine: NEGATIVE
Glucose, UA: NEGATIVE mg/dL
Hgb urine dipstick: NEGATIVE
Ketones, ur: NEGATIVE mg/dL
Leukocytes,Ua: NEGATIVE
Nitrite: NEGATIVE
Protein, ur: NEGATIVE mg/dL
Specific Gravity, Urine: 1.015 (ref 1.005–1.030)
pH: 5 (ref 5.0–8.0)

## 2023-01-30 LAB — ETHANOL: Alcohol, Ethyl (B): 129 mg/dL — ABNORMAL HIGH (ref <10)

## 2023-01-30 MED ORDER — IOHEXOL 300 MG/ML  SOLN
100.0000 mL | Freq: Once | INTRAMUSCULAR | Status: AC | PRN
  Administered 2023-01-30: 100 mL via INTRAVENOUS

## 2023-01-30 NOTE — Discharge Instructions (Signed)
You were seen in the emergency department today for constipation.  We recommend that you use one or more of the following over-the-counter medications in the order described:   1)  Colace (or Dulcolax) 100 mg:  This is a stool softener, and you may take it once or twice a day as needed. 2)  Senna tablets:  This is a bowel stimulant that will help "push" out your stool. It is the next step to add after you have tried a stool softener. 3)  Miralax (powder):  This medication works by drawing additional fluid into your intestines and helps to flush out your stool.  Mix the powder with water or juice according to label instructions.  It may help if the Colace and Senna are not sufficient, but you must be sure to use the recommended amount of water or juice when you mix up the powder. 4)  Look for magnesium citrate at the pharmacy (it is usually a small glass bottle).  Drink the bottle according to the label instructions.  Remember that narcotic pain medications are constipating, so avoid them or minimize their use.  Drink plenty of fluids.  Please return to the Emergency Department immediately if you develop new or worsening symptoms that concern you, such as (but not limited to) fever > 101 degrees, severe abdominal pain, or persistent vomiting.

## 2023-01-30 NOTE — ED Triage Notes (Signed)
Pt to ED via POV from home. Pt appears intoxicated and reports started drinking yesterday and still feels drunk today. Pt states needs detox. Pt states he is here because he can't gain weight. Pt's friend here and states pt sent by Dr. Everlene Farrier due to lower abd pain and sweats. Pt reports hx of hernia repairs and appendectomy.

## 2023-01-30 NOTE — ED Provider Notes (Signed)
[facility] Provider Note    Event Date/Time   First MD Initiated Contact with Patient 01/30/23 1204     (approximate)   History   Chief Complaint Abdominal Pain   HPI  Shane Dominguez is a 62 y.o. male with past medical history of hypertension, hepatitis C, and bilateral inguinal hernia status post repair who presents to the ED complaining of abdominal pain.  Patient reports that he has been dealing with increasing pain around both sides of his groin for the past 3 weeks.  He has noticed some mild swelling in his inguinal areas on both sides, denies any swelling around his scrotum.  He has not had any nausea, vomiting, diarrhea, or constipation, also denies any difficulty urinating or dysuria.  He underwent bilateral inguinal hernia repair a couple of years ago, family reports they spoke to his surgeon earlier today who recommended he come to the ED for further evaluation.  Patient does admit that he drank a significant amount of alcohol last night to help deal with the pain.     Physical Exam   Triage Vital Signs: ED Triage Vitals  Encounter Vitals Group     BP 01/30/23 1107 (!) 140/78     Systolic BP Percentile --      Diastolic BP Percentile --      Pulse Rate 01/30/23 1107 84     Resp 01/30/23 1107 20     Temp 01/30/23 1107 98 F (36.7 C)     Temp Source 01/30/23 1107 Oral     SpO2 01/30/23 1107 98 %     Weight --      Height --      Head Circumference --      Peak Flow --      Pain Score 01/30/23 1108 7     Pain Loc --      Pain Education --      Exclude from Growth Chart --     Most recent vital signs: Vitals:   01/30/23 1107  BP: (!) 140/78  Pulse: 84  Resp: 20  Temp: 98 F (36.7 C)  SpO2: 98%    Constitutional: Alert and oriented. Eyes: Conjunctivae are normal. Head: Atraumatic. Nose: No congestion/rhinnorhea. Mouth/Throat: Mucous membranes are moist.  Cardiovascular: Normal rate, regular rhythm. Grossly normal heart  sounds.  2+ radial pulses bilaterally. Respiratory: Normal respiratory effort.  No retractions. Lungs CTAB. Gastrointestinal: Soft and tender to palpation in bilateral inguinal areas with no associated edema or palpable hernia.  No distention. Genitourinary: No testicular tenderness bilaterally. Musculoskeletal: No lower extremity tenderness nor edema.  Neurologic:  Normal speech and language. No gross focal neurologic deficits are appreciated.    ED Results / Procedures / Treatments   Labs (all labs ordered are listed, but only abnormal results are displayed) Labs Reviewed  COMPREHENSIVE METABOLIC PANEL - Abnormal; Notable for the following components:      Result Value   Potassium 3.4 (*)    Calcium 8.6 (*)    Total Protein 8.4 (*)    All other components within normal limits  URINALYSIS, ROUTINE W REFLEX MICROSCOPIC - Abnormal; Notable for the following components:   Color, Urine YELLOW (*)    APPearance CLEAR (*)    All other components within normal limits  ETHANOL - Abnormal; Notable for the following components:   Alcohol, Ethyl (B) 129 (*)    All other components within normal limits  LIPASE, BLOOD  CBC   RADIOLOGY CT  abdomen/pelvis reviewed and interpreted by me with no inflammatory changes, focal fluid collections, or dilated bowel loops.  PROCEDURES:  Critical Care performed: No  Procedures   MEDICATIONS ORDERED IN ED: Medications  iohexol (OMNIPAQUE) 300 MG/ML solution 100 mL (100 mLs Intravenous Contrast Given 01/30/23 1331)     IMPRESSION / MDM / ASSESSMENT AND PLAN / ED COURSE  I reviewed the triage vital signs and the nursing notes.                              62 y.o. male with past medical history of hypertension and hepatitis C who presents to the ED complaining of increasing pain in the bilateral inguinal areas for the past 2 to 3 weeks.  Patient's presentation is most consistent with acute presentation with potential threat to life or bodily  function.  Differential diagnosis includes, but is not limited to, inguinal hernia, bowel obstruction, UTI, kidney stone, diverticulitis, colitis, epididymitis.  Patient nontoxic-appearing and in no acute distress, vital signs are unremarkable.  His abdomen is soft, he does have some tenderness over both sides of his lower abdomen and inguinal area, but no obvious hernia noted.  Labs are reassuring with no significant anemia, leukocytosis, electrolyte abnormality, or AKI.  LFTs and lipase are unremarkable.  We will further assess with CT imaging, urinalysis with no signs of infection.  Patient declines pain or nausea medication at this time.  CT imaging is reassuring, shows constipation but is otherwise negative for acute process.  Minimal pain noted on reassessment and he is appropriate for discharge home, was counseled on bowel regimen.  He was counseled to follow-up with PCP and to return to the ED for new or worsening symptoms, patient agrees with plan.      FINAL CLINICAL IMPRESSION(S) / ED DIAGNOSES   Final diagnoses:  Lower abdominal pain  Constipation, unspecified constipation type     Rx / DC Orders   ED Discharge Orders     None        Note:  This document was prepared using Dragon voice recognition software and may include unintentional dictation errors.   Chesley Noon, MD 01/30/23 [phone]

## 2023-02-16 NOTE — Progress Notes (Signed)
Cardiology Office Note    Date:  02/17/2023   ID:  Shane Dominguez, DOB 11-29-1960, MRN [id number]  PCP:  Patient, No Pcp Per  Cardiologist:  [name], MD  Electrophysiologist:  None   Chief Complaint: Follow-up  History of Present Illness:   Shane Dominguez is a 62 y.o. male with history of HTN, hepatitis C, tobacco use, and GERD who presents for follow-up of HTN.    Lexiscan MPI in 12/2017 showed a small in size, severe, fixed basal and mid inferior septal defect consistent with scar, though could not rule out artifact.  There was no significant ischemia.  LVEF greater than 65% with normal wall motion.  This was felt to be low risk.  Echo in 12/2017 showed an EF of 60 to 65%, normal wall motion, normal LV diastolic function parameters, mild mitral regurgitation, and normal RV systolic function and ventricular cavity size.  He has been seen over the years for uncontrolled hypertension in the setting of nonadherence to medications and excessive sodium intake.  He was seen in the office in 05/2022, at which time he was struggling with seasonal allergies and had been out of amlodipine for approximately three weeks, though had been able to restart the medication two days prior to his visit.  He was sporadically checking BP at home noting a systolic blood pressure 140 mmHg on last check.  He was trying to exercise and minimize sodium intake, though did eat out at times.  He was started on HCTZ 12.5 mg daily (after stable labs confirmed) with continuation of amlodipine 10 mg.  He was seen in the office on 07/13/2022 and had not yet started HCTZ.  He reported adherence to amlodipine 10 mg.  Blood pressure 148/78 in the office.  It was recommended he start previously recommended HCTZ 12.5 mg daily.  He was last seen in the office in 10/2022 and remained without symptoms of angina or cardiac decompensation.  He was not checking blood pressure at home.  He had not yet started HCTZ.  He reported adherence  to amlodipine 10 mg.  He continued to smoke 1 pack/day and was eating out at restaurants/takeout food on a regular basis.  He reported he was going to undergo lifestyle modification.  Blood pressure was elevated at 160/92.  He declined initiation of HCTZ or carvedilol indicated he wanted to work on lifestyle modification.  He was continued on amlodipine 10 mg at his request.  Seen in the ED in 01/2023 with lower abdominal pain and found to have constipation on CT imaging.  He has been taking stool softener with some improvement in symptoms.  He comes in doing well from a cardiac perspective and is without symptoms of angina or cardiac decompensation.  No palpitations, dizziness, presyncope, or syncope.  No lower extremity swelling or progressive orthopnea.  He is not checking his blood pressures at home.  He indicates he has decreased his sodium intake, continues to eat out at restaurants approximately 3 days/week.  Reports adherence to amlodipine.  Main focus at this time is constipation.  He does not have any acute cardiac concerns.   Labs independently reviewed: 01/2023 - Hgb 15.2, PLT 262, potassium 3.4, BUN 12, serum creatinine 0.72, albumin 4.4, AST/ALT normal 12/2017 - TC 131, TG 57, HDL 35, LDL 85 08/2016 - albumin 4.1, AST/ALT normal 04/2013 - TSH normal  Past Medical History:  Diagnosis Date   Acute appendicitis    Appendicitis 08/18/2016   GERD (gastroesophageal reflux  disease)    NO MEDS   Hepatitis C    Hypertension    Tobacco abuse     Past Surgical History:  Procedure Laterality Date   HERNIA REPAIR  11/18/2016   INGUINAL HERNIA REPAIR Bilateral 11/17/2016   Procedure: LAPAROSCOPIC BILATERAL INGUINAL HERNIA REPAIR;  Surgeon: Leafy Ro, MD;  Location: [facility] ORS;  Service: General;  Laterality: Bilateral;   INSERTION OF MESH Bilateral 11/17/2016   Procedure: INSERTION OF MESH;  Surgeon: Leafy Ro, MD;  Location: [facility] ORS;  Service: General;  Laterality: Bilateral;    LAPAROSCOPIC APPENDECTOMY N/A 08/18/2016   Procedure: APPENDECTOMY LAPAROSCOPIC;  Surgeon: Leafy Ro, MD;  Location: [facility] ORS;  Service: General;  Laterality: N/A;    Current Medications: Current Meds  Medication Sig   amLODipine (NORVASC) 10 MG tablet Take 1 tablet (10 mg total) by mouth daily.   Multiple Vitamin (MULTIVITAMIN) tablet Take 1 tablet by mouth daily.    polyethylene glycol (MIRALAX) 17 g packet Take 17 g by mouth daily as needed (for constipation).    Allergies:   Patient has no known allergies.   Social History   Socioeconomic History   Marital status: Single    Spouse name: Not on file   Number of children: Not on file   Years of education: Not on file   Highest education level: Not on file  Occupational History   Not on file  Tobacco Use   Smoking status: Every Day    Current packs/day: 0.50    Average packs/day: 0.5 packs/day for 40.0 years (20.0 ttl pk-yrs)    Types: Cigarettes   Smokeless tobacco: Never   Tobacco comments:    10/21/2020 Quit smoking 30 days ago       04/28/2021 1/2 PPD  Vaping Use   Vaping status: Never Used  Substance and Sexual Activity   Alcohol use: Not Currently   Drug use: No   Sexual activity: Not on file  Other Topics Concern   Not on file  Social History Narrative   Not on file   Social Drivers of Health   Financial Resource Strain: Not on file  Food Insecurity: Not on file  Transportation Needs: Not on file  Physical Activity: Not on file  Stress: Not on file  Social Connections: Not on file     Family History:  The patient's family history includes COPD in his father; Emphysema in his father; Heart attack in his brother; Stroke in his brother and mother.  ROS:   12-point review of systems is negative unless otherwise noted in the HPI.   EKGs/Labs/Other Studies Reviewed:    Studies reviewed were summarized above. The additional studies were reviewed today:  2D echo 12/20/2017: - Left ventricle: The  cavity size was normal. Wall thickness was    normal. Systolic function was normal. The estimated ejection    fraction was in the range of 60% to 65%. Wall motion was normal;    there were no regional wall motion abnormalities. Left    ventricular diastolic function parameters were normal.  - Mitral valve: There was mild regurgitation.  - Right ventricle: The cavity size was normal. Wall thickness was    normal. Systolic function was normal.  __________   Eugenie Birks MPI 12/20/2017: Abnormal, probably low risk myocardial perfusion stress test There is a small in size, severe, fixed basal and mid inferoseptal defect consistent with scar but cannot rule out artifact. There is no significant ischemia. The left ventricular ejection  fraction is normal(>65%) with normal regional wall motion. The sensitivity and specificity of the study are degraded by extracardiac activity and diaphragmatic attenuation.   EKG:  EKG is ordered today.  The EKG ordered today demonstrates NSR, 75 bpm, left axis deviation, LVH with early repolarization abnormality, no acute ST-T changes  Recent Labs: 01/30/2023: ALT 15; BUN 12; Creatinine, Ser 0.72; Hemoglobin 15.2; Platelets 262; Potassium 3.4; Sodium 143  Recent Lipid Panel    Component Value Date/Time   CHOL 131 12/20/2017 0531   TRIG 57 12/20/2017 0531   HDL 35 (L) 12/20/2017 0531   CHOLHDL 3.7 12/20/2017 0531   VLDL 11 12/20/2017 0531   LDLCALC 85 12/20/2017 0531    PHYSICAL EXAM:    VS:  BP (!) 156/83 (BP Location: Left Arm, Patient Position: Sitting, Cuff Size: Normal)   Pulse 75   Ht 6\' 1"  (1.854 m)   Wt 141 lb 6.4 oz (64.1 kg)   SpO2 98%   BMI 18.66 kg/m   BMI: Body mass index is 18.66 kg/m.  Physical Exam Vitals reviewed.  Constitutional:      Appearance: He is well-developed.  HENT:     Head: Normocephalic and atraumatic.  Eyes:     General:        Right eye: No discharge.        Left eye: No discharge.  Neck:     Vascular: No JVD.   Cardiovascular:     Rate and Rhythm: Normal rate and regular rhythm.     Pulses:          Posterior tibial pulses are 2+ on the right side and 2+ on the left side.     Heart sounds: Normal heart sounds, S1 normal and S2 normal. Heart sounds not distant. No midsystolic click and no opening snap. No murmur heard.    No friction rub.  Pulmonary:     Effort: Pulmonary effort is normal. No respiratory distress.     Breath sounds: Normal breath sounds. No decreased breath sounds, wheezing, rhonchi or rales.  Chest:     Chest wall: No tenderness.  Abdominal:     General: There is no distension.  Musculoskeletal:     Cervical back: Normal range of motion.     Right lower leg: No edema.     Left lower leg: No edema.  Skin:    General: Skin is warm and dry.     Nails: There is no clubbing.  Neurological:     Mental Status: He is alert and oriented to person, place, and time.  Psychiatric:        Speech: Speech normal.        Behavior: Behavior normal.        Thought Content: Thought content normal.        Judgment: Judgment normal.     Wt Readings from Last 3 Encounters:  02/17/23 141 lb 6.4 oz (64.1 kg)  10/18/22 142 lb 9.6 oz (64.7 kg)  07/13/22 142 lb (64.4 kg)     ASSESSMENT & PLAN:   HTN: Blood pressure remains elevated in the office today.  Not checking blood pressure at home.  Reports adherence to amlodipine 10 mg daily.  He declines escalation of pharmacotherapy at this time.  He is aware of risks of untreated blood pressure.  He indicates he will consider escalation of pharmacotherapy at next visit.  Low-sodium diet recommended.  Constipation: This is his main focus at today's visit.  Advised he can take  MiraLAX for now.  Follow-up with PCP.    Disposition: F/u with Dr. Okey Dupre or an APP in 2 months.   Medication Adjustments/Labs and Tests Ordered: Current medicines are reviewed at length with the patient today.  Concerns regarding medicines are outlined above. Medication  changes, Labs and Tests ordered today are summarized above and listed in the Patient Instructions accessible in Encounters.   Signed, Eula Listen, PA-C 02/17/2023 10:32 AM     [facility] [place] [address] [place], [place] [address] [phone]

## 2023-02-17 ENCOUNTER — Ambulatory Visit: Payer: Medicaid Other | Attending: Physician Assistant | Admitting: Physician Assistant

## 2023-02-17 ENCOUNTER — Encounter: Payer: Self-pay | Admitting: Physician Assistant

## 2023-02-17 VITALS — BP 156/83 | HR 75 | Ht 73.0 in | Wt 141.4 lb

## 2023-02-17 DIAGNOSIS — I10 Essential (primary) hypertension: Secondary | ICD-10-CM

## 2023-02-17 DIAGNOSIS — K5900 Constipation, unspecified: Secondary | ICD-10-CM

## 2023-02-17 MED ORDER — POLYETHYLENE GLYCOL 3350 17 G PO PACK: 17.0000 g | PACK | Freq: Every day | ORAL | 3 refills | Status: AC | PRN

## 2023-02-17 NOTE — Patient Instructions (Signed)
Medication Instructions:  Your physician recommends the following medication changes.  START TAKING: Miralax 17 grams daily as needed for constipation   *If you need a refill on your cardiac medications before your next appointment, please call your pharmacy*   Lab Work: None ordered at this time    Follow-Up: At [facility], you and your health needs are our priority.  As part of our continuing mission to provide you with exceptional heart care, we have created designated Provider Care Teams.  These Care Teams include your primary Cardiologist (physician) and Advanced Practice Providers (APPs -  Physician Assistants and Nurse Practitioners) who all work together to provide you with the care you need, when you need it.  We recommend signing up for the patient portal called "MyChart".  Sign up information is provided on this After Visit Summary.  MyChart is used to connect with patients for Virtual Visits (Telemedicine).  Patients are able to view lab/test results, encounter notes, upcoming appointments, etc.  Non-urgent messages can be sent to your provider as well.   To learn more about what you can do with MyChart, go to [email or web address].    Your next appointment:   2 month(s)  Provider:   You may see [name], MD or one of the following Advanced Practice Providers on your designated Care Team:   Nicolasa Ducking, NP Eula Listen, PA-C Cadence Fransico Michael, PA-C Charlsie Quest, NP Carlos Levering, NP

## 2023-04-19 NOTE — Progress Notes (Unsigned)
 Cardiology Office Note    Date:  04/20/2023   ID:  Shane Dominguez, DOB 12-24-1960, MRN [id number]  PCP:  Patient, No Pcp Per  Cardiologist:  [name], MD  Electrophysiologist:  None   Chief Complaint: Follow-up  History of Present Illness:   Shane Dominguez is a 63 y.o. male with history of HTN, hepatitis C, tobacco use, and GERD who presents for follow-up of HTN.    Lexiscan MPI in 12/2017 showed a small in size, severe, fixed basal and mid inferior septal defect consistent with scar, though could not rule out artifact.  There was no significant ischemia.  LVEF greater than 65% with normal wall motion.  This was felt to be low risk.  Echo in 12/2017 showed an EF of 60 to 65%, normal wall motion, normal LV diastolic function parameters, mild mitral regurgitation, and normal RV systolic function and ventricular cavity size.  He has been seen over the years for uncontrolled hypertension in the setting of nonadherence to medications and excessive sodium intake.  He was seen in the office in 05/2022, at which time he was struggling with seasonal allergies and had been out of amlodipine for approximately three weeks, though had been able to restart the medication two days prior to his visit.  He was sporadically checking BP at home noting a systolic blood pressure 140 mmHg on last check.  He was trying to exercise and minimize sodium intake, though did eat out at times.  He was started on HCTZ 12.5 mg daily (after stable labs confirmed) with continuation of amlodipine 10 mg.  He was seen in the office on 07/13/2022 and had not yet started HCTZ.  He reported adherence to amlodipine 10 mg.  Blood pressure 148/78 in the office.  It was recommended he start previously recommended HCTZ 12.5 mg daily.  He was seen in the office in 10/2022 and remained without symptoms of angina or cardiac decompensation.  He was not checking blood pressure at home.  He had not yet started HCTZ.  He reported adherence to  amlodipine 10 mg.  He continued to smoke 1 pack/day and was eating out at restaurants/takeout food on a regular basis.  He reported he was going to undergo lifestyle modification.  Blood pressure was elevated at 160/92.  He declined initiation of HCTZ or carvedilol indicated he wanted to work on lifestyle modification.  He was continued on amlodipine 10 mg at his request.  He was most recently seen in the office on 02/17/2023 and was without symptoms of angina or cardiac decompensation.  He was not checking his blood pressure at home.  He continued to eat out at restaurants approximately 3 days/week.  He reported adherence to amlodipine.  Blood pressure remained elevated at 156/83.  He declined escalation of pharmacotherapy at that time.  He comes in doing well from a cardiac perspective and is without symptoms of angina or cardiac decompensation.  No dyspnea, palpitations, dizziness, presyncope, or syncope.  No lower extremity swelling or progressive orthopnea.  He snacks on chips frequently.  Eating out at restaurants 2 to 3 days/week.  Continues to smoke half pack per day.  Not checking blood pressure at home.  Reports adherence to amlodipine 10 mg.  Agreeable to escalation of pharmacotherapy today.   Labs independently reviewed: 01/2023 - Hgb 15.2, PLT 262, potassium 3.4, BUN 12, serum creatinine 0.72, albumin 4.4, AST/ALT normal 12/2017 - TC 131, TG 57, HDL 35, LDL 85 08/2016 - albumin 4.1, AST/ALT  normal 04/2013 - TSH normal  Past Medical History:  Diagnosis Date   Acute appendicitis    Appendicitis 08/18/2016   GERD (gastroesophageal reflux disease)    NO MEDS   Hepatitis C    Hypertension    Tobacco abuse     Past Surgical History:  Procedure Laterality Date   HERNIA REPAIR  11/18/2016   INGUINAL HERNIA REPAIR Bilateral 11/17/2016   Procedure: LAPAROSCOPIC BILATERAL INGUINAL HERNIA REPAIR;  Surgeon: Leafy Ro, MD;  Location: [facility] OR[facility];  Service: General;  Laterality: Bilateral;    INSERTION OF MESH Bilateral 11/17/2016   Procedure: INSERTION OF MESH;  Surgeon: Leafy Ro, MD;  Location: [facility];  Service: General;  Laterality: Bilateral;   LAPAROSCOPIC APPENDECTOMY N/A 08/18/2016   Procedure: APPENDECTOMY LAPAROSCOPIC;  Surgeon: Leafy Ro, MD;  Location: [facility] OR[facility];  Service: General;  Laterality: N/A;    Current Medications: Current Meds  Medication Sig   amLODipine (NORVASC) 10 MG tablet Take 1 tablet (10 mg total) by mouth daily.   carvedilol (COREG) 6.25 MG tablet Take 1 tablet (6.25 mg total) by mouth 2 (two) times daily.   Multiple Vitamin (MULTIVITAMIN) tablet Take 1 tablet by mouth daily.    polyethylene glycol (MIRALAX) 17 g packet Take 17 g by mouth daily as needed (for constipation).    Allergies:   Patient has no known allergies.   Social History   Socioeconomic History   Marital status: Single    Spouse name: Not on file   Number of children: Not on file   Years of education: Not on file   Highest education level: Not on file  Occupational History   Not on file  Tobacco Use   Smoking status: Every Day    Current packs/day: 0.50    Average packs/day: 0.5 packs/day for 40.0 years (20.0 ttl pk-yrs)    Types: Cigarettes   Smokeless tobacco: Never   Tobacco comments:    10/21/2020 Quit smoking 30 days ago       04/28/2021 1/2 PPD  Vaping Use   Vaping status: Never Used  Substance and Sexual Activity   Alcohol use: Not Currently   Drug use: No   Sexual activity: Not on file  Other Topics Concern   Not on file  Social History Narrative   Not on file   Social Drivers of Health   Financial Resource Strain: Not on file  Food Insecurity: Not on file  Transportation Needs: Not on file  Physical Activity: Not on file  Stress: Not on file  Social Connections: Not on file     Family History:  The patient's family history includes COPD in his father; Emphysema in his father; Heart attack in his brother; Stroke in his brother and  mother.  ROS:   12-point review of systems is negative unless otherwise noted in the HPI.   EKGs/Labs/Other Studies Reviewed:    Studies reviewed were summarized above. The additional studies were reviewed today:  2D echo 12/20/2017: - Left ventricle: The cavity size was normal. Wall thickness was    normal. Systolic function was normal. The estimated ejection    fraction was in the range of 60% to 65%. Wall motion was normal;    there were no regional wall motion abnormalities. Left    ventricular diastolic function parameters were normal.  - Mitral valve: There was mild regurgitation.  - Right ventricle: The cavity size was normal. Wall thickness was    normal. Systolic function was normal.  __________   Eugenie Birks MPI 12/20/2017: Abnormal, probably low risk myocardial perfusion stress test There is a small in size, severe, fixed basal and mid inferoseptal defect consistent with scar but cannot rule out artifact. There is no significant ischemia. The left ventricular ejection fraction is normal(>65%) with normal regional wall motion. The sensitivity and specificity of the study are degraded by extracardiac activity and diaphragmatic attenuation.   EKG:  EKG is not ordered today.    Recent Labs: 01/30/2023: ALT 15; BUN 12; Creatinine, Ser 0.72; Hemoglobin 15.2; Platelets 262; Potassium 3.4; Sodium 143  Recent Lipid Panel    Component Value Date/Time   CHOL 131 12/20/2017 0531   TRIG 57 12/20/2017 0531   HDL 35 (L) 12/20/2017 0531   CHOLHDL 3.7 12/20/2017 0531   VLDL 11 12/20/2017 0531   LDLCALC 85 12/20/2017 0531    PHYSICAL EXAM:    VS:  BP (!) 160/92 (BP Location: Left Arm, Patient Position: Sitting, Cuff Size: Normal)   Pulse 86   Ht 6\' 1"  (1.854 m)   Wt 142 lb 9.6 oz (64.7 kg)   SpO2 98%   BMI 18.81 kg/m   BMI: Body mass index is 18.81 kg/m.  Physical Exam Vitals reviewed.  Constitutional:      Appearance: He is well-developed.  HENT:     Head:  Normocephalic and atraumatic.  Eyes:     General:        Right eye: No discharge.        Left eye: No discharge.  Cardiovascular:     Rate and Rhythm: Normal rate and regular rhythm.     Heart sounds: Normal heart sounds, S1 normal and S2 normal. Heart sounds not distant. No midsystolic click and no opening snap. No murmur heard.    No friction rub.  Pulmonary:     Effort: Pulmonary effort is normal. No respiratory distress.     Breath sounds: Normal breath sounds. No decreased breath sounds, wheezing, rhonchi or rales.  Chest:     Chest wall: No tenderness.  Abdominal:     General: There is no distension.  Musculoskeletal:     Cervical back: Normal range of motion.     Right lower leg: No edema.     Left lower leg: No edema.  Skin:    General: Skin is warm and dry.     Nails: There is no clubbing.  Neurological:     Mental Status: He is alert and oriented to person, place, and time.  Psychiatric:        Speech: Speech normal.        Behavior: Behavior normal.        Thought Content: Thought content normal.        Judgment: Judgment normal.     Wt Readings from Last 3 Encounters:  04/20/23 142 lb 9.6 oz (64.7 kg)  02/17/23 141 lb 6.4 oz (64.1 kg)  10/18/22 142 lb 9.6 oz (64.7 kg)     ASSESSMENT & PLAN:   HTN: Blood pressure remains suboptimally controlled.  Add carvedilol 6.25 mg twice daily.  Continue amlodipine 10 mg daily.  Low-sodium diet recommended.  Tobacco use: Continues to smoke half pack per day.  Complete cessation recommended.   Disposition: F/u with Dr. Okey Dupre or an APP in 2 months.   Medication Adjustments/Labs and Tests Ordered: Current medicines are reviewed at length with the patient today.  Concerns regarding medicines are outlined above. Medication changes, Labs and Tests ordered today are summarized above  and listed in the Patient Instructions accessible in Encounters.   SignedEula Listen, PA-C 04/20/2023 10:07 AM     [facility]  [place] [address] [place], [place] [address] [phone]

## 2023-04-20 ENCOUNTER — Encounter: Payer: Self-pay | Admitting: Physician Assistant

## 2023-04-20 ENCOUNTER — Ambulatory Visit: Payer: Medicaid Other | Attending: Physician Assistant | Admitting: Physician Assistant

## 2023-04-20 VITALS — BP 160/92 | HR 86 | Ht 73.0 in | Wt 142.6 lb

## 2023-04-20 DIAGNOSIS — Z720 Tobacco use: Secondary | ICD-10-CM | POA: Diagnosis not present

## 2023-04-20 DIAGNOSIS — I10 Essential (primary) hypertension: Secondary | ICD-10-CM | POA: Diagnosis not present

## 2023-04-20 MED ORDER — CARVEDILOL 6.25 MG PO TABS: 6.2500 mg | ORAL_TABLET | Freq: Two times a day (BID) | ORAL | 3 refills | Status: DC

## 2023-04-20 NOTE — Patient Instructions (Signed)
Medication Instructions:  Your physician recommends the following medication changes.  START TAKING: Coreg 6.25 mg twice daily  *If you need a refill on your cardiac medications before your next appointment, please call your pharmacy*   Lab Work: None ordered at this time  If you have labs (blood work) drawn today and your tests are completely normal, you will receive your results only by: MyChart Message (if you have MyChart) OR A paper copy in the mail If you have any lab test that is abnormal or we need to change your treatment, we will call you to review the results.   Follow-Up: At [facility], you and your health needs are our priority.  As part of our continuing mission to provide you with exceptional heart care, we have created designated Provider Care Teams.  These Care Teams include your primary Cardiologist (physician) and Advanced Practice Providers (APPs -  Physician Assistants and Nurse Practitioners) who all work together to provide you with the care you need, when you need it.  We recommend signing up for the patient portal called "MyChart".  Sign up information is provided on this After Visit Summary.  MyChart is used to connect with patients for Virtual Visits (Telemedicine).  Patients are able to view lab/test results, encounter notes, upcoming appointments, etc.  Non-urgent messages can be sent to your provider as well.   To learn more about what you can do with MyChart, go to [email or web address].    Your next appointment:   2 month(s)  Provider:   You may see [name], MD or one of the following Advanced Practice Providers on your designated Care Team:   Eula Listen, [place]

## 2023-06-20 ENCOUNTER — Telehealth: Payer: Self-pay | Admitting: Internal Medicine

## 2023-06-20 ENCOUNTER — Ambulatory Visit: Payer: Medicaid Other | Admitting: Physician Assistant

## 2023-06-20 MED ORDER — AMLODIPINE BESYLATE 10 MG PO TABS: 10.0000 mg | ORAL_TABLET | Freq: Every day | ORAL | 3 refills | Status: DC

## 2023-06-20 NOTE — Progress Notes (Deleted)
 Cardiology Office Note    Date:  06/20/2023   ID:  Shane Dominguez, DOB 12-08-1960, MRN [id number]  PCP:  Patient, No Pcp Per  Cardiologist:  [name], MD  Electrophysiologist:  None   Chief Complaint: Follow-up  History of Present Illness:   Shane Dominguez is a 63 y.o. male with history of HTN, hepatitis C, tobacco use, and GERD who presents for follow-up of HTN.    Lexiscan MPI in 12/2017 showed a small in size, severe, fixed basal and mid inferior septal defect consistent with scar, though could not rule out artifact.  There was no significant ischemia.  LVEF greater than 65% with normal wall motion.  This was felt to be low risk.  Echo in 12/2017 showed an EF of 60 to 65%, normal wall motion, normal LV diastolic function parameters, mild mitral regurgitation, and normal RV systolic function and ventricular cavity size.  He has been seen over the years for uncontrolled hypertension in the setting of nonadherence to medications and excessive sodium intake.  He was seen in the office in 05/2022, at which time he was struggling with seasonal allergies and had been out of amlodipine for approximately three weeks, though had been able to restart the medication two days prior to his visit.  He was sporadically checking BP at home noting a systolic blood pressure 140 mmHg on last check.  He was trying to exercise and minimize sodium intake, though did eat out at times.  He was started on HCTZ 12.5 mg daily (after stable labs confirmed) with continuation of amlodipine 10 mg.  He was seen in the office on 07/13/2022 and had not yet started HCTZ.  He reported adherence to amlodipine 10 mg.  Blood pressure 148/78 in the office.  It was recommended he start previously recommended HCTZ 12.5 mg daily.  He was seen in the office in 10/2022 and was not checking blood pressure at home.  He had not yet started HCTZ.  He reported adherence to amlodipine 10 mg.  He continued to smoke 1 pack/day and was eating  out at restaurants/takeout food on a regular basis.  He reported he was going to undergo lifestyle modification.  Blood pressure was elevated at 160/92.  He declined initiation of HCTZ or carvedilol indicated he wanted to work on lifestyle modification.  He was continued on amlodipine 10 mg at his request.  He was seen in the office on 02/17/2023 and was not checking his blood pressure at home.  He continued to eat out at restaurants approximately 3 days/week.  He reported adherence to amlodipine.  Blood pressure remained elevated at 156/83.  He declined escalation of pharmacotherapy at that time.  He most recently seen on 04/20/2023 and was eating out at restaurants 2 to 3 days/week.  He continued to smoke half pack per day.  Not checking blood pressure at home.  Reported adherence to amlodipine 10 mg.  He was agreeable to escalation of pharmacotherapy with the addition of Coreg 6.25 mg bid.  ***   Labs independently reviewed: 01/2023 - Hgb 15.2, PLT 262, potassium 3.4, BUN 12, serum creatinine 0.72, albumin 4.4, AST/ALT normal 12/2017 - TC 131, TG 57, HDL 35, LDL 85 08/2016 - albumin 4.1, AST/ALT normal 04/2013 - TSH normal  Past Medical History:  Diagnosis Date   Acute appendicitis    Appendicitis 08/18/2016   GERD (gastroesophageal reflux disease)    NO MEDS   Hepatitis C    Hypertension  Tobacco abuse     Past Surgical History:  Procedure Laterality Date   HERNIA REPAIR  11/18/2016   INGUINAL HERNIA REPAIR Bilateral 11/17/2016   Procedure: LAPAROSCOPIC BILATERAL INGUINAL HERNIA REPAIR;  Surgeon: Leafy Ro, MD;  Location: [facility];  Service: General;  Laterality: Bilateral;   INSERTION OF MESH Bilateral 11/17/2016   Procedure: INSERTION OF MESH;  Surgeon: Leafy Ro, MD;  Location: [facility];  Service: General;  Laterality: Bilateral;   LAPAROSCOPIC APPENDECTOMY N/A 08/18/2016   Procedure: APPENDECTOMY LAPAROSCOPIC;  Surgeon: Leafy Ro, MD;  Location: [facility];  Service:  General;  Laterality: N/A;    Current Medications: No outpatient medications have been marked as taking for the 06/20/23 encounter (Appointment) with Sondra Barges, PA-C.    Allergies:   Patient has no known allergies.   Social History   Socioeconomic History   Marital status: Single    Spouse name: Not on file   Number of children: Not on file   Years of education: Not on file   Highest education level: Not on file  Occupational History   Not on file  Tobacco Use   Smoking status: Every Day    Current packs/day: 0.50    Average packs/day: 0.5 packs/day for 40.0 years (20.0 ttl pk-yrs)    Types: Cigarettes   Smokeless tobacco: Never   Tobacco comments:    10/21/2020 Quit smoking 30 days ago       04/28/2021 1/2 PPD  Vaping Use   Vaping status: Never Used  Substance and Sexual Activity   Alcohol use: Not Currently   Drug use: No   Sexual activity: Not on file  Other Topics Concern   Not on file  Social History Narrative   Not on file   Social Drivers of Health   Financial Resource Strain: Not on file  Food Insecurity: Not on file  Transportation Needs: Not on file  Physical Activity: Not on file  Stress: Not on file  Social Connections: Not on file     Family History:  The patient's family history includes COPD in his father; Emphysema in his father; Heart attack in his brother; Stroke in his brother and mother.  ROS:   12-point review of systems is negative unless otherwise noted in the HPI.   EKGs/Labs/Other Studies Reviewed:    Studies reviewed were summarized above. The additional studies were reviewed today:  2D echo 12/20/2017: - Left ventricle: The cavity size was normal. Wall thickness was    normal. Systolic function was normal. The estimated ejection    fraction was in the range of 60% to 65%. Wall motion was normal;    there were no regional wall motion abnormalities. Left    ventricular diastolic function parameters were normal.  - Mitral  valve: There was mild regurgitation.  - Right ventricle: The cavity size was normal. Wall thickness was    normal. Systolic function was normal.  __________   Eugenie Birks MPI 12/20/2017: Abnormal, probably low risk myocardial perfusion stress test There is a small in size, severe, fixed basal and mid inferoseptal defect consistent with scar but cannot rule out artifact. There is no significant ischemia. The left ventricular ejection fraction is normal(>65%) with normal regional wall motion. The sensitivity and specificity of the study are degraded by extracardiac activity and diaphragmatic attenuation.   EKG:  EKG is ordered today.  The EKG ordered today demonstrates ***  Recent Labs: 01/30/2023: ALT 15; BUN 12; Creatinine, Ser 0.72; Hemoglobin 15.2;  Platelets 262; Potassium 3.4; Sodium 143  Recent Lipid Panel    Component Value Date/Time   CHOL 131 12/20/2017 0531   TRIG 57 12/20/2017 0531   HDL 35 (L) 12/20/2017 0531   CHOLHDL 3.7 12/20/2017 0531   VLDL 11 12/20/2017 0531   LDLCALC 85 12/20/2017 0531    PHYSICAL EXAM:    VS:  There were no vitals taken for this visit.  BMI: There is no height or weight on file to calculate BMI.  Physical Exam  Wt Readings from Last 3 Encounters:  04/20/23 142 lb 9.6 oz (64.7 kg)  02/17/23 141 lb 6.4 oz (64.1 kg)  10/18/22 142 lb 9.6 oz (64.7 kg)     ASSESSMENT & PLAN:   HTN: Blood pressure  Tobacco use:   {Are you ordering a CV Procedure (e.g. stress test, cath, DCCV, TEE, etc)?   Press F2        :[id number]}     Disposition: F/u with Dr. Nolan Battle or an APP in ***.   Medication Adjustments/Labs and Tests Ordered: Current medicines are reviewed at length with the patient today.  Concerns regarding medicines are outlined above. Medication changes, Labs and Tests ordered today are summarized above and listed in the Patient Instructions accessible in Encounters.   Signed, Varney Gentleman, PA-C 06/20/2023 7:07 AM     [facility] -  [place] [address] [place], [place] [address] [phone]

## 2023-06-20 NOTE — Telephone Encounter (Signed)
*  STAT* If patient is at the pharmacy, call can be transferred to refill team.   1. Which medications need to be refilled? (please list name of each medication and dose if known) amlodopine   2. Would you like to learn more about the convenience, safety, & potential cost savings by using the [facility]? no     3. Are you open to using the [facility] (Type [facility]. no ).   4. Which pharmacy/location (including street and city if local pharmacy) is medication to be sent to?[facility] [place] [facility]   5. Do they need a 30 day or 90 day supply? 90  Patient missed appointment today. Stated he is out of medication

## 2023-06-26 ENCOUNTER — Ambulatory Visit: Admitting: Physician Assistant

## 2023-06-26 NOTE — Progress Notes (Deleted)
 Cardiology Office Note    Date:  06/26/2023   ID:  KAYRON HICKLIN, DOB 12/19/1960, MRN [id number]  PCP:  Patient, No Pcp Per  Cardiologist:  Sammy Crisp, MD  Electrophysiologist:  None   Chief Complaint: Follow-up  History of Present Illness:   ALDYN TOON is a 63 y.o. male with history of HTN, hepatitis C, tobacco use, and GERD who presents for follow-up of HTN.    Lexiscan  MPI in 12/2017 showed a small in size, severe, fixed basal and mid inferior septal defect consistent with scar, though could not rule out artifact.  There was no significant ischemia.  LVEF greater than 65% with normal wall motion.  This was felt to be low risk.  Echo in 12/2017 showed an EF of 60 to 65%, normal wall motion, normal LV diastolic function parameters, mild mitral regurgitation, and normal RV systolic function and ventricular cavity size.  He has been seen over the years for uncontrolled hypertension in the setting of nonadherence to medications and excessive sodium intake.  He was seen in the office in 05/2022, at which time he was struggling with seasonal allergies and had been out of amlodipine  for approximately three weeks, though had been able to restart the medication two days prior to his visit.  He was sporadically checking BP at home noting a systolic blood pressure 140 mmHg on last check.  He was trying to exercise and minimize sodium intake, though did eat out at times.  He was started on HCTZ 12.5 mg daily (after stable labs confirmed) with continuation of amlodipine  10 mg.  He was seen in the office on 07/13/2022 and had not yet started HCTZ.  He reported adherence to amlodipine  10 mg.  Blood pressure 148/78 in the office.  It was recommended he start previously recommended HCTZ 12.5 mg daily.  He was seen in the office in 10/2022 and was not checking blood pressure at home.  He had not yet started HCTZ.  He reported adherence to amlodipine  10 mg.  He continued to smoke 1 pack/day and was eating  out at restaurants/takeout food on a regular basis.  He reported he was going to undergo lifestyle modification.  Blood pressure was elevated at 160/92.  He declined initiation of HCTZ or carvedilol  indicated he wanted to work on lifestyle modification.  He was continued on amlodipine  10 mg at his request.  He was seen in the office on 02/17/2023 and was not checking his blood pressure at home.  He continued to eat out at restaurants approximately 3 days/week.  He reported adherence to amlodipine .  Blood pressure remained elevated at 156/83.  He declined escalation of pharmacotherapy at that time.  He most recently seen on 04/20/2023 and was eating out at restaurants 2 to 3 days/week.  He continued to smoke half pack per day.  Not checking blood pressure at home.  Reported adherence to amlodipine  10 mg.  He was agreeable to escalation of pharmacotherapy with the addition of Coreg  6.25 mg bid.  ***   Labs independently reviewed: 01/2023 - Hgb 15.2, PLT 262, potassium 3.4, BUN 12, serum creatinine 0.72, albumin 4.4, AST/ALT normal 12/2017 - TC 131, TG 57, HDL 35, LDL 85 08/2016 - albumin 4.1, AST/ALT normal 04/2013 - TSH normal  Past Medical History:  Diagnosis Date   Acute appendicitis    Appendicitis 08/18/2016   GERD (gastroesophageal reflux disease)    NO MEDS   Hepatitis C    Hypertension  Tobacco abuse     Past Surgical History:  Procedure Laterality Date   HERNIA REPAIR  11/18/2016   INGUINAL HERNIA REPAIR Bilateral 11/17/2016   Procedure: LAPAROSCOPIC BILATERAL INGUINAL HERNIA REPAIR;  Surgeon: Alben Alma, MD;  Location: [facility];  Service: General;  Laterality: Bilateral;   INSERTION OF MESH Bilateral 11/17/2016   Procedure: INSERTION OF MESH;  Surgeon: Alben Alma, MD;  Location: [facility];  Service: General;  Laterality: Bilateral;   LAPAROSCOPIC APPENDECTOMY N/A 08/18/2016   Procedure: APPENDECTOMY LAPAROSCOPIC;  Surgeon: Alben Alma, MD;  Location: [facility];  Service:  General;  Laterality: N/A;    Current Medications: No outpatient medications have been marked as taking for the 06/26/23 encounter (Appointment) with Roark Chick, PA-C.    Allergies:   Patient has no known allergies.   Social History   Socioeconomic History   Marital status: Single    Spouse name: Not on file   Number of children: Not on file   Years of education: Not on file   Highest education level: Not on file  Occupational History   Not on file  Tobacco Use   Smoking status: Every Day    Current packs/day: 0.50    Average packs/day: 0.5 packs/day for 40.0 years (20.0 ttl pk-yrs)    Types: Cigarettes   Smokeless tobacco: Never   Tobacco comments:    10/21/2020 Quit smoking 30 days ago       04/28/2021 1/2 PPD  Vaping Use   Vaping status: Never Used  Substance and Sexual Activity   Alcohol use: Not Currently   Drug use: No   Sexual activity: Not on file  Other Topics Concern   Not on file  Social History Narrative   Not on file   Social Drivers of Health   Financial Resource Strain: Not on file  Food Insecurity: Not on file  Transportation Needs: Not on file  Physical Activity: Not on file  Stress: Not on file  Social Connections: Not on file     Family History:  The patient's family history includes COPD in his father; Emphysema in his father; Heart attack in his brother; Stroke in his brother and mother.  ROS:   12-point review of systems is negative unless otherwise noted in the HPI.   EKGs/Labs/Other Studies Reviewed:    Studies reviewed were summarized above. The additional studies were reviewed today:  2D echo 12/20/2017: - Left ventricle: The cavity size was normal. Wall thickness was    normal. Systolic function was normal. The estimated ejection    fraction was in the range of 60% to 65%. Wall motion was normal;    there were no regional wall motion abnormalities. Left    ventricular diastolic function parameters were normal.  - Mitral  valve: There was mild regurgitation.  - Right ventricle: The cavity size was normal. Wall thickness was    normal. Systolic function was normal.  __________   Lexiscan  MPI 12/20/2017: Abnormal, probably low risk myocardial perfusion stress test There is a small in size, severe, fixed basal and mid inferoseptal defect consistent with scar but cannot rule out artifact. There is no significant ischemia. The left ventricular ejection fraction is normal(>65%) with normal regional wall motion. The sensitivity and specificity of the study are degraded by extracardiac activity and diaphragmatic attenuation.   EKG:  EKG is ordered today.  The EKG ordered today demonstrates ***  Recent Labs: 01/30/2023: ALT 15; BUN 12; Creatinine, Ser 0.72; Hemoglobin 15.2;  Platelets 262; Potassium 3.4; Sodium 143  Recent Lipid Panel    Component Value Date/Time   CHOL 131 12/20/2017 0531   TRIG 57 12/20/2017 0531   HDL 35 (L) 12/20/2017 0531   CHOLHDL 3.7 12/20/2017 0531   VLDL 11 12/20/2017 0531   LDLCALC 85 12/20/2017 0531    PHYSICAL EXAM:    VS:  There were no vitals taken for this visit.  BMI: There is no height or weight on file to calculate BMI.  Physical Exam  Wt Readings from Last 3 Encounters:  04/20/23 142 lb 9.6 oz (64.7 kg)  02/17/23 141 lb 6.4 oz (64.1 kg)  10/18/22 142 lb 9.6 oz (64.7 kg)     ASSESSMENT & PLAN:   HTN: Blood pressure  Tobacco use:   {Are you ordering a CV Procedure (e.g. stress test, cath, DCCV, TEE, etc)?   Press F2        :[id number]}     Disposition: F/u with Dr. Nolan Battle or an APP in ***.   Medication Adjustments/Labs and Tests Ordered: Current medicines are reviewed at length with the patient today.  Concerns regarding medicines are outlined above. Medication changes, Labs and Tests ordered today are summarized above and listed in the Patient Instructions accessible in Encounters.   Signed, Varney Gentleman, PA-C 06/26/2023 7:04 AM     [facility] -  [place] [address] [place], [place] [address] [phone]

## 2023-07-14 ENCOUNTER — Ambulatory Visit: Admitting: Physician Assistant

## 2023-07-14 NOTE — Progress Notes (Deleted)
 Cardiology Office Note    Date:  07/14/2023   ID:  Shane Dominguez, DOB Aug 26, 1960, MRN [id number]  PCP:  Patient, No Pcp Per  Cardiologist:  Sammy Crisp, MD  Electrophysiologist:  None   Chief Complaint: ***  History of Present Illness:   Shane Dominguez is a 63 y.o. male with history of hypertension, hepatitis C, tobacco use, and GERD who presents for ***    Lexiscan  MPI 12/2017 showed a small in size, severe, fixed basal and inferior septal defect consistent with scar though cannot rule out artifact.  There was no significant ischemia, LVEF greater than 65% with normal wall motion.  This was felt to be low risk.  Echo in 12/2017 showed EF of 60 to 65%, normal wall motion, normal LV diastolic function parameters, mild MR, and normal RV systolic function/size.  He has been seen over the years for uncontrolled hypertension in the setting of nonadherence to medications and excessive sodium intake.  He was seen in the office 05/2022 at which time he was struggling with seasonal allergies and had been out of amlodipine  for approximately 3 weeks, though had not been able to restart the medication 2 days prior to this visit.  He was sporadically checking BP at home noting a systolic pressure 140 mmHg on last check.  He was trying to exercise and minimize sodium intake, though he did eat out at times.  He has been started on HCTZ 12.5 mg daily with continuation of amlodipine  10 mg.  Seen again in the office 07/2022 and had not yet started HCTZ.  He reported adherence to amlodipine .  Blood pressure 148/78 in the office.  It was recommended he start previously recommended hydrochlorothiazide .  Again seen in office 10/2022 and remained without symptoms of angina or cardiac decompensation.  He was not checking blood pressures at home.  He had not yet started HCTZ.  He reported adherence to amlodipine .  He was continued to smoke 1 pack/day and was eating out at restaurants/takeout food on a regular basis.  He  reported he was going to undergo lifestyle modification.  Blood pressure was elevated at 160/92.  He declined initiation of HCTZ or carvedilol  indicated he wanted to work on lifestyle modification.  He was continued on amlodipine  at his request.  He was again seen in the office 02/2023 and was without symptoms of angina or cardiac decompensation.  He is not checking his blood pressure at home.  He continues to eat out at restaurants approximately 3 days/week.  He reported adherence to amlodipine .  Blood pressure remained elevated at 156/83.  He declined escalation of pharmacotherapy at that time.   Patient was most recently seen in the office 04/2023 for follow-up on hypertension.  He was doing well without symptoms of angina or cardiac decompensation.  He reported snacking on chips frequently and eating out at restaurants 2 to 3 days/week.  Continued to smoke half pack per day.  He was not taking blood pressures at home.  He reported adherence to amlodipine .  Blood pressure remained elevated at 160/92.  It was recommended that he add carvedilol  6.25 mg twice daily.  ***  Labs independently reviewed: 01/30/2023-Hgb 15.2, HCT 43.9, NA 143, K3.4, BUN 12, CR 0.72, normal LFTs  Past Medical History:  Diagnosis Date   Acute appendicitis    Appendicitis 08/18/2016   GERD (gastroesophageal reflux disease)    NO MEDS   Hepatitis C    Hypertension    Tobacco abuse  Past Surgical History:  Procedure Laterality Date   HERNIA REPAIR  11/18/2016   INGUINAL HERNIA REPAIR Bilateral 11/17/2016   Procedure: LAPAROSCOPIC BILATERAL INGUINAL HERNIA REPAIR;  Surgeon: Alben Alma, MD;  Location: [facility];  Service: General;  Laterality: Bilateral;   INSERTION OF MESH Bilateral 11/17/2016   Procedure: INSERTION OF MESH;  Surgeon: Alben Alma, MD;  Location: [facility];  Service: General;  Laterality: Bilateral;   LAPAROSCOPIC APPENDECTOMY N/A 08/18/2016   Procedure: APPENDECTOMY LAPAROSCOPIC;  Surgeon: Alben Alma, MD;  Location: [facility];  Service: General;  Laterality: N/A;    Current Medications: No outpatient medications have been marked as taking for the 07/14/23 encounter (Appointment) with Shane Chick, PA-C.    Allergies:   Patient has no known allergies.   Social History   Socioeconomic History   Marital status: Single    Spouse name: Not on file   Number of children: Not on file   Years of education: Not on file   Highest education level: Not on file  Occupational History   Not on file  Tobacco Use   Smoking status: Every Day    Current packs/day: 0.50    Average packs/day: 0.5 packs/day for 40.0 years (20.0 ttl pk-yrs)    Types: Cigarettes   Smokeless tobacco: Never   Tobacco comments:    10/21/2020 Quit smoking 30 days ago       04/28/2021 1/2 PPD  Vaping Use   Vaping status: Never Used  Substance and Sexual Activity   Alcohol use: Not Currently   Drug use: No   Sexual activity: Not on file  Other Topics Concern   Not on file  Social History Narrative   Not on file   Social Drivers of Health   Financial Resource Strain: Not on file  Food Insecurity: Not on file  Transportation Needs: Not on file  Physical Activity: Not on file  Stress: Not on file  Social Connections: Not on file     Family History:  The patient's family history includes COPD in his father; Emphysema in his father; Heart attack in his brother; Stroke in his brother and mother.  ROS:   12-point review of systems is negative unless otherwise noted in the HPI.   EKGs/Labs/Other Studies Reviewed:    Studies reviewed were summarized above. The additional studies were reviewed today:  2D echo 12/20/2017: - Left ventricle: The cavity size was normal. Wall thickness was    normal. Systolic function was normal. The estimated ejection    fraction was in the range of 60% to 65%. Wall motion was normal;    there were no regional wall motion abnormalities. Left    ventricular diastolic  function parameters were normal.  - Mitral valve: There was mild regurgitation.  - Right ventricle: The cavity size was normal. Wall thickness was    normal. Systolic function was normal.   Lexiscan  MPI 12/20/2017: Abnormal, probably low risk myocardial perfusion stress test There is a small in size, severe, fixed basal and mid inferoseptal defect consistent with scar but cannot rule out artifact. There is no significant ischemia. The left ventricular ejection fraction is normal(>65%) with normal regional wall motion. The sensitivity and specificity of the study are degraded by extracardiac activity and diaphragmatic attenuation.  EKG:  EKG is ordered today.  The EKG ordered today demonstrates ***  Recent Labs: 01/30/2023: ALT 15; BUN 12; Creatinine, Ser 0.72; Hemoglobin 15.2; Platelets 262; Potassium 3.4; Sodium 143  Recent Lipid  Panel    Component Value Date/Time   CHOL 131 12/20/2017 0531   TRIG 57 12/20/2017 0531   HDL 35 (L) 12/20/2017 0531   CHOLHDL 3.7 12/20/2017 0531   VLDL 11 12/20/2017 0531   LDLCALC 85 12/20/2017 0531    PHYSICAL EXAM:    VS:  There were no vitals taken for this visit.  BMI: There is no height or weight on file to calculate BMI.  Physical Exam  Wt Readings from Last 3 Encounters:  04/20/23 142 lb 9.6 oz (64.7 kg)  02/17/23 141 lb 6.4 oz (64.1 kg)  10/18/22 142 lb 9.6 oz (64.7 kg)     ASSESSMENT & PLAN:   Hypertension   Tobacco use   {Are you ordering a CV Procedure (e.g. stress test, cath, DCCV, TEE, etc)?   Press F2        :[id number]}     Disposition: F/u with Dr. Nolan Battle or an APP in ***.   Medication Adjustments/Labs and Tests Ordered: Current medicines are reviewed at length with the patient today.  Concerns regarding medicines are outlined above. Medication changes, Labs and Tests ordered today are summarized above and listed in the Patient Instructions accessible in Encounters.   Beather Liming, PA-C 07/14/2023 7:40 AM      [facility] [place] [address] [place], [place] [address] [phone]

## 2023-07-19 NOTE — Progress Notes (Unsigned)
 Cardiology Office Note    Date:  07/21/2023   ID:  BETHANY MAYTON, DOB 1961/03/05, MRN [id number]  PCP:  Patient, No Pcp Per  Cardiologist:  Sammy Crisp, MD  Electrophysiologist:  None   Chief Complaint: Follow up  History of Present Illness:   ERVEN WINKLE is a 63 y.o. male with history of hypertension, hepatitis C, tobacco use, and GERD who presents for follow up on hypertension.     Lexiscan  MPI 12/2017 showed a small in size, severe, fixed basal and inferior septal defect consistent with scar though cannot rule out artifact.  There was no significant ischemia, LVEF greater than 65% with normal wall motion.  This was felt to be low risk.  Echo in 12/2017 showed EF of 60 to 65%, normal wall motion, normal LV diastolic function parameters, mild MR, and normal RV systolic function/size.  He has been seen over the years for uncontrolled hypertension in the setting of nonadherence to medications and excessive sodium intake.  He was seen in the office 05/2022 at which time he was struggling with seasonal allergies and had been out of amlodipine  for approximately 3 weeks, though had not been able to restart the medication 2 days prior to this visit.  He was sporadically checking BP at home noting a systolic pressure 140 mmHg on last check.  He was trying to exercise and minimize sodium intake, though he did eat out at times.  He had been started on HCTZ 12.5 mg daily with continuation of amlodipine  10 mg.  Seen again in the office 07/2022 and had not yet started HCTZ.  He reported adherence to amlodipine .  Blood pressure 148/78 in the office.  It was recommended he start previously recommended hydrochlorothiazide .  Again seen in office 10/2022 and remained without symptoms of angina or cardiac decompensation.  He was not checking blood pressures at home.  He had not yet started HCTZ.  He reported adherence to amlodipine .  He was continued to smoke 1 pack/day and was eating out at  restaurants/takeout food on a regular basis.  He reported he was going to undergo lifestyle modification.  Blood pressure was elevated at 160/92.  He declined initiation of HCTZ or carvedilol  indicated he wanted to work on lifestyle modification.  He was continued on amlodipine  at his request.  He was again seen in the office 02/2023 and was without symptoms of angina or cardiac decompensation.  He is not checking his blood pressure at home.  He continues to eat out at restaurants approximately 3 days/week.  He reported adherence to amlodipine .  Blood pressure remained elevated at 156/83.  He declined escalation of pharmacotherapy at that time.   Patient was most recently seen in the office 04/2023 for follow-up on hypertension.  He was doing well without symptoms of angina or cardiac decompensation.  He reported snacking on chips frequently and eating out at restaurants 2 to 3 days/week.  Continued to smoke half pack per day.  He was not taking blood pressures at home.  He reported adherence to amlodipine .  Blood pressure remained elevated at 160/92.  It was recommended that he add carvedilol  6.25 mg twice daily.  Patient is doing well from a cardiac perspective without symptoms of angina or cardiac decompensation.  He denies chest pain, shortness of breath, lower extremity edema, lightheadedness, dizziness, and orthopnea. He does not take his blood pressure at home.  He is compliant with amlodipine  without adverse effect. He did not start carvedilol  which was  ordered at his prior visit.  He tells me this is because he does not want to take an additional medication because it will not help.  Patient voices understanding of adverse effects of unregulated hypertension.  However, he would like to find a natural way to treat it.  He tells me that he continues to eat at restaurants frequently and snack on potato chips. He continues to smoke half a pack of cigarettes per day.  He does exercise daily by  walking.  Labs independently reviewed: 01/30/2023-Hgb 15.2, HCT 43.9, NA 143, K3.4, BUN 12, CR 0.72, normal LFTs  Past Medical History:  Diagnosis Date   Acute appendicitis    Appendicitis 08/18/2016   GERD (gastroesophageal reflux disease)    NO MEDS   Hepatitis C    Hypertension    Tobacco abuse     Past Surgical History:  Procedure Laterality Date   HERNIA REPAIR  11/18/2016   INGUINAL HERNIA REPAIR Bilateral 11/17/2016   Procedure: LAPAROSCOPIC BILATERAL INGUINAL HERNIA REPAIR;  Surgeon: Alben Alma, MD;  Location: [facility];  Service: General;  Laterality: Bilateral;   INSERTION OF MESH Bilateral 11/17/2016   Procedure: INSERTION OF MESH;  Surgeon: Alben Alma, MD;  Location: [facility];  Service: General;  Laterality: Bilateral;   LAPAROSCOPIC APPENDECTOMY N/A 08/18/2016   Procedure: APPENDECTOMY LAPAROSCOPIC;  Surgeon: Alben Alma, MD;  Location: [facility];  Service: General;  Laterality: N/A;    Current Medications: Current Meds  Medication Sig   amLODipine  (NORVASC ) 10 MG tablet Take 1 tablet (10 mg total) by mouth daily.   Multiple Vitamin (MULTIVITAMIN) tablet Take 1 tablet by mouth daily.    polyethylene glycol (MIRALAX ) 17 g packet Take 17 g by mouth daily as needed (for constipation).    Allergies:   Patient has no known allergies.   Social History   Socioeconomic History   Marital status: Single    Spouse name: Not on file   Number of children: Not on file   Years of education: Not on file   Highest education level: Not on file  Occupational History   Not on file  Tobacco Use   Smoking status: Every Day    Current packs/day: 0.50    Average packs/day: 0.5 packs/day for 40.0 years (20.0 ttl pk-yrs)    Types: Cigarettes   Smokeless tobacco: Never   Tobacco comments:    10/21/2020 Quit smoking 30 days ago       04/28/2021 1/2 PPD  Vaping Use   Vaping status: Never Used  Substance and Sexual Activity   Alcohol use: Not Currently   Drug use: No    Sexual activity: Not on file  Other Topics Concern   Not on file  Social History Narrative   Not on file   Social Drivers of Health   Financial Resource Strain: Not on file  Food Insecurity: Not on file  Transportation Needs: Not on file  Physical Activity: Not on file  Stress: Not on file  Social Connections: Not on file     Family History:  The patient's family history includes COPD in his father; Emphysema in his father; Heart attack in his brother; Stroke in his brother and mother.  ROS:   12-point review of systems is negative unless otherwise noted in the HPI.   EKGs/Labs/Other Studies Reviewed:    Studies reviewed were summarized above. The additional studies were reviewed today:  2D echo 12/20/2017: - Left ventricle: The cavity size was normal. Wall  thickness was    normal. Systolic function was normal. The estimated ejection    fraction was in the range of 60% to 65%. Wall motion was normal;    there were no regional wall motion abnormalities. Left    ventricular diastolic function parameters were normal.  - Mitral valve: There was mild regurgitation.  - Right ventricle: The cavity size was normal. Wall thickness was    normal. Systolic function was normal.   Lexiscan  MPI 12/20/2017: Abnormal, probably low risk myocardial perfusion stress test There is a small in size, severe, fixed basal and mid inferoseptal defect consistent with scar but cannot rule out artifact. There is no significant ischemia. The left ventricular ejection fraction is normal(>65%) with normal regional wall motion. The sensitivity and specificity of the study are degraded by extracardiac activity and diaphragmatic attenuation.   Recent Labs: 01/30/2023: ALT 15; BUN 12; Creatinine, Ser 0.72; Hemoglobin 15.2; Platelets 262; Potassium 3.4; Sodium 143  Recent Lipid Panel    Component Value Date/Time   CHOL 131 12/20/2017 0531   TRIG 57 12/20/2017 0531   HDL 35 (L) 12/20/2017 0531    CHOLHDL 3.7 12/20/2017 0531   VLDL 11 12/20/2017 0531   LDLCALC 85 12/20/2017 0531    PHYSICAL EXAM:    VS:  BP (!) 146/88   Pulse 84   Ht 6\' 1"  (1.854 m)   Wt 142 lb 3.2 oz (64.5 kg)   SpO2 99%   BMI 18.76 kg/m   BMI: Body mass index is 18.76 kg/m.  Physical Exam Vitals and nursing note reviewed.  Constitutional:      Appearance: Normal appearance.  Cardiovascular:     Rate and Rhythm: Normal rate and regular rhythm.     Heart sounds: No murmur heard.    No gallop.  Pulmonary:     Effort: Pulmonary effort is normal.     Breath sounds: Normal breath sounds. No wheezing or rales.  Musculoskeletal:     Right lower leg: No edema.     Left lower leg: No edema.  Skin:    General: Skin is warm and dry.  Neurological:     General: No focal deficit present.     Mental Status: He is alert and oriented to person, place, and time. Mental status is at baseline.  Psychiatric:        Mood and Affect: Mood normal.        Behavior: Behavior normal.     Wt Readings from Last 3 Encounters:  07/21/23 142 lb 3.2 oz (64.5 kg)  04/20/23 142 lb 9.6 oz (64.7 kg)  02/17/23 141 lb 6.4 oz (64.1 kg)     ASSESSMENT & PLAN:   Hypertension - Blood pressure remains elevated at 146/88. Does not take blood pressure at home. Patient did not start previously prescribed carvedilol . He is refusing escalation of pharmacotherapy today. Long discussion regarding importance of blood pressure regulation. Patient states understanding and would like to work on lifestyle modification at this time. Resources provided regarding heart healthy and low-sodium diet.  Tobacco use - Continues to smoke half pack per day. Recommended complete cessation.   Disposition: F/u with Dr. Nolan Battle or an APP in 3 months.   Medication Adjustments/Labs and Tests Ordered: Current medicines are reviewed at length with the patient today.  Concerns regarding medicines are outlined above. Medication changes, Labs and Tests ordered  today are summarized above and listed in the Patient Instructions accessible in Encounters.   Beather Liming, PA-C 07/21/2023 10:27  AM     [facility] [address] [place], [place] [address] [phone]

## 2023-07-21 ENCOUNTER — Ambulatory Visit: Attending: Physician Assistant | Admitting: Physician Assistant

## 2023-07-21 ENCOUNTER — Encounter: Payer: Self-pay | Admitting: Physician Assistant

## 2023-07-21 VITALS — BP 146/88 | HR 84 | Ht 73.0 in | Wt 142.2 lb

## 2023-07-21 DIAGNOSIS — Z720 Tobacco use: Secondary | ICD-10-CM | POA: Insufficient documentation

## 2023-07-21 DIAGNOSIS — I10 Essential (primary) hypertension: Secondary | ICD-10-CM | POA: Insufficient documentation

## 2023-07-21 NOTE — Patient Instructions (Signed)
 Medication Instructions:  Your Physician recommend you continue on your current medication as directed.    *If you need a refill on your cardiac medications before your next appointment, please call your pharmacy*  Lab Work: No labs ordered today  If you have labs (blood work) drawn today and your tests are completely normal, you will receive your results only by: MyChart Message (if you have MyChart) OR A paper copy in the mail If you have any lab test that is abnormal or we need to change your treatment, we will call you to review the results.  Testing/Procedures: No test ordered today   Follow-Up: At [facility], you and your health needs are our priority.  As part of our continuing mission to provide you with exceptional heart care, our providers are all part of one team.  This team includes your primary Cardiologist (physician) and Advanced Practice Providers or APPs (Physician Assistants and Nurse Practitioners) who all work together to provide you with the care you need, when you need it.  Your next appointment:   3 month(s)  Provider:   Sammy Crisp, MD, Gildardo Labrador, PA-C, or Varney Gentleman, PA-C

## 2023-11-08 ENCOUNTER — Ambulatory Visit: Attending: Physician Assistant | Admitting: Physician Assistant

## 2023-11-08 NOTE — Progress Notes (Deleted)
 Cardiology Office Note    Date:  11/08/2023   ID:  Shane Dominguez, DOB [birth year], MRN [id number]  PCP:  Patient, No Pcp Per  Cardiologist:  Lonni Hanson, MD  Electrophysiologist:  None   Chief Complaint: Follow-up  History of Present Illness:   Shane Dominguez is a 63 y.o. male with history of HTN, hepatitis C, tobacco use, and GERD who presents for follow-up of HTN.    Lexiscan  MPI in 12/2017 showed a small in size, severe, fixed basal and mid inferior septal defect consistent with scar, though could not rule out artifact.  There was no significant ischemia.  LVEF greater than 65% with normal wall motion.  This was felt to be low risk.  Echo in 12/2017 showed an EF of 60 to 65%, normal wall motion, normal LV diastolic function parameters, mild mitral regurgitation, and normal RV systolic function and ventricular cavity size.  He has been seen over the years for uncontrolled hypertension in the setting of nonadherence to medications and excessive sodium intake.  He was seen in the office in 05/2022, at which time he was struggling with seasonal allergies and had been out of amlodipine  for approximately three weeks, though had been able to restart the medication two days prior to his visit.  He was sporadically checking BP at home noting a systolic blood pressure 140 mmHg on last check.  He was trying to exercise and minimize sodium intake, though did eat out at times.  He was started on HCTZ 12.5 mg daily (after stable labs confirmed) with continuation of amlodipine  10 mg.  He was seen in the office on 07/13/2022 and had not yet started HCTZ.  He reported adherence to amlodipine  10 mg.  Blood pressure 148/78 in the office.  It was recommended he start previously recommended HCTZ 12.5 mg daily.  He was seen in the office in 10/2022 and was not checking blood pressure at home.  He had not yet started HCTZ.  He reported adherence to amlodipine  10 mg.  He continued to smoke 1 pack/day and was eating out  at restaurants/takeout food on a regular basis.  He reported he was going to undergo lifestyle modification.  Blood pressure was elevated at 160/92.  He declined initiation of HCTZ or carvedilol  indicated he wanted to work on lifestyle modification.  He was continued on amlodipine  10 mg at his request.  He was seen in the office on 02/17/2023 and was not checking his blood pressure at home.  He continued to eat out at restaurants approximately 3 days/week.  He reported adherence to amlodipine .  Blood pressure remained elevated at 156/83.  He declined escalation of pharmacotherapy at that time.  He was seen in 04/2023 and continued to not check blood pressure at home.  He reported adherence to amlodipine  10 mg.  He indicated he was agreeable to escalation of pharmacotherapy with carvedilol  6.25 mg twice daily added.  He was most recently seen in the office on 07/21/2023 with a blood pressure 146/88 and reported he did not start carvedilol .  Reported adherence to amlodipine .  He continues to consume a diet high in sodium and smoked 1/2 pack of cigarettes per day.  He declined escalation of pharmacotherapy at that time.  ***   Labs independently reviewed: 01/2023 - Hgb 15.2, PLT 262, potassium 3.4, BUN 12, serum creatinine 0.72, albumin 4.4, AST/ALT normal 12/2017 - TC 131, TG 57, HDL 35, LDL 85 08/2016 - albumin 4.1, AST/ALT normal 04/2013 - TSH  normal  Past Medical History:  Diagnosis Date   Acute appendicitis    Appendicitis 08/18/2016   GERD (gastroesophageal reflux disease)    NO MEDS   Hepatitis C    Hypertension    Tobacco abuse     Past Surgical History:  Procedure Laterality Date   HERNIA REPAIR  11/18/2016   INGUINAL HERNIA REPAIR Bilateral 11/17/2016   Procedure: LAPAROSCOPIC BILATERAL INGUINAL HERNIA REPAIR;  Surgeon: Jordis Laneta FALCON, MD;  Location: [facility];  Service: General;  Laterality: Bilateral;   INSERTION OF MESH Bilateral 11/17/2016   Procedure: INSERTION OF MESH;  Surgeon:  Jordis Laneta FALCON, MD;  Location: [facility];  Service: General;  Laterality: Bilateral;   LAPAROSCOPIC APPENDECTOMY N/A 08/18/2016   Procedure: APPENDECTOMY LAPAROSCOPIC;  Surgeon: Jordis Laneta FALCON, MD;  Location: [facility];  Service: General;  Laterality: N/A;    Current Medications: No outpatient medications have been marked as taking for the 11/08/23 encounter (Appointment) with Abigail Bernardino HERO, PA-C.    Allergies:   Patient has no known allergies.   Social History   Socioeconomic History   Marital status: Single    Spouse name: Not on file   Number of children: Not on file   Years of education: Not on file   Highest education level: Not on file  Occupational History   Not on file  Tobacco Use   Smoking status: Every Day    Current packs/day: 0.50    Average packs/day: 0.5 packs/day for 40.0 years (20.0 ttl pk-yrs)    Types: Cigarettes   Smokeless tobacco: Never   Tobacco comments:    10/21/2020 Quit smoking 30 days ago       04/28/2021 1/2 PPD  Vaping Use   Vaping status: Never Used  Substance and Sexual Activity   Alcohol use: Not Currently   Drug use: No   Sexual activity: Not on file  Other Topics Concern   Not on file  Social History Narrative   Not on file   Social Drivers of Health   Financial Resource Strain: Not on file  Food Insecurity: Not on file  Transportation Needs: Not on file  Physical Activity: Not on file  Stress: Not on file  Social Connections: Not on file     Family History:  The patient's family history includes COPD in his father; Emphysema in his father; Heart attack in his brother; Stroke in his brother and mother.  ROS:   12-point review of systems is negative unless otherwise noted in the HPI.   EKGs/Labs/Other Studies Reviewed:    Studies reviewed were summarized above. The additional studies were reviewed today:  2D echo 12/20/2017: - Left ventricle: The cavity size was normal. Wall thickness was    normal. Systolic function was  normal. The estimated ejection    fraction was in the range of 60% to 65%. Wall motion was normal;    there were no regional wall motion abnormalities. Left    ventricular diastolic function parameters were normal.  - Mitral valve: There was mild regurgitation.  - Right ventricle: The cavity size was normal. Wall thickness was    normal. Systolic function was normal.  __________   Lexiscan  MPI 12/20/2017: Abnormal, probably low risk myocardial perfusion stress test There is a small in size, severe, fixed basal and mid inferoseptal defect consistent with scar but cannot rule out artifact. There is no significant ischemia. The left ventricular ejection fraction is normal(>65%) with normal regional wall motion. The sensitivity and specificity  of the study are degraded by extracardiac activity and diaphragmatic attenuation.   EKG:  EKG is ordered today.  The EKG ordered today demonstrates ***  Recent Labs: 01/30/2023: ALT 15; BUN 12; Creatinine, Ser 0.72; Hemoglobin 15.2; Platelets 262; Potassium 3.4; Sodium 143  Recent Lipid Panel    Component Value Date/Time   CHOL 131 12/20/2017 0531   TRIG 57 12/20/2017 0531   HDL 35 (L) 12/20/2017 0531   CHOLHDL 3.7 12/20/2017 0531   VLDL 11 12/20/2017 0531   LDLCALC 85 12/20/2017 0531    PHYSICAL EXAM:    VS:  There were no vitals taken for this visit.  BMI: There is no height or weight on file to calculate BMI.  Physical Exam  Wt Readings from Last 3 Encounters:  07/21/23 142 lb 3.2 oz (64.5 kg)  04/20/23 142 lb 9.6 oz (64.7 kg)  02/17/23 141 lb 6.4 oz (64.1 kg)     ASSESSMENT & PLAN:   HTN: Blood pressure  Tobacco use:   {Are you ordering a CV Procedure (e.g. stress test, cath, DCCV, TEE, etc)?   Press F2        :[id number]}     Disposition: F/u with Dr. Mady or an APP in ***.   Medication Adjustments/Labs and Tests Ordered: Current medicines are reviewed at length with the patient today.  Concerns regarding medicines are  outlined above. Medication changes, Labs and Tests ordered today are summarized above and listed in the Patient Instructions accessible in Encounters.   Signed, Bernardino Bring, PA-C 11/08/2023 7:32 AM     [facility] [address] [place], [place] [address] [phone]

## 2023-11-13 ENCOUNTER — Other Ambulatory Visit: Payer: Self-pay

## 2023-11-13 ENCOUNTER — Emergency Department

## 2023-11-13 ENCOUNTER — Emergency Department
Admission: EM | Admit: 2023-11-13 | Discharge: 2023-11-13 | Disposition: A | Discharge status: Home / Self Care | Attending: Emergency Medicine | Admitting: Emergency Medicine

## 2023-11-13 ENCOUNTER — Encounter: Payer: Self-pay | Admitting: Emergency Medicine

## 2023-11-13 DIAGNOSIS — F17200 Nicotine dependence, unspecified, uncomplicated: Secondary | ICD-10-CM | POA: Insufficient documentation

## 2023-11-13 DIAGNOSIS — L03213 Periorbital cellulitis: Secondary | ICD-10-CM | POA: Insufficient documentation

## 2023-11-13 DIAGNOSIS — I10 Essential (primary) hypertension: Secondary | ICD-10-CM | POA: Diagnosis not present

## 2023-11-13 DIAGNOSIS — H0289 Other specified disorders of eyelid: Secondary | ICD-10-CM | POA: Diagnosis present

## 2023-11-13 LAB — COMPREHENSIVE METABOLIC PANEL WITH GFR
ALT: 12 U/L (ref 0–44)
AST: 21 U/L (ref 15–41)
Albumin: 4.2 g/dL (ref 3.5–5.0)
Alkaline Phosphatase: 59 U/L (ref 38–126)
Anion gap: 8 (ref 5–15)
BUN: 13 mg/dL (ref 8–23)
CO2: 26 mmol/L (ref 22–32)
Calcium: 8.7 mg/dL — ABNORMAL LOW (ref 8.9–10.3)
Chloride: 98 mmol/L (ref 98–111)
Creatinine, Ser: 0.75 mg/dL (ref 0.61–1.24)
GFR, Estimated: >60 mL/min (ref >60)
Glucose, Bld: 115 mg/dL — ABNORMAL HIGH (ref 70–99)
Potassium: 3.5 mmol/L (ref 3.5–5.1)
Sodium: 132 mmol/L — ABNORMAL LOW (ref 135–145)
Total Bilirubin: 0.5 mg/dL (ref 0.0–1.2)
Total Protein: 8.1 g/dL (ref 6.5–8.1)

## 2023-11-13 LAB — CBC
HCT: 48.3 % (ref 39.0–52.0)
Hemoglobin: 16.7 g/dL (ref 13.0–17.0)
MCH: 32.7 pg (ref 26.0–34.0)
MCHC: 34.6 g/dL (ref 30.0–36.0)
MCV: 94.7 fL (ref 80.0–100.0)
Platelets: 265 K/uL (ref 150–400)
RBC: 5.1 MIL/uL (ref 4.22–5.81)
RDW: 12.3 % (ref 11.5–15.5)
WBC: 5.7 K/uL (ref 4.0–10.5)
nRBC: 0 % (ref 0.0–0.2)

## 2023-11-13 LAB — RESP PANEL BY RT-PCR (RSV, FLU A&B, COVID)  RVPGX2
Influenza A by PCR: NEGATIVE
Influenza B by PCR: NEGATIVE
Resp Syncytial Virus by PCR: NEGATIVE
SARS Coronavirus 2 by RT PCR: NEGATIVE

## 2023-11-13 LAB — URINALYSIS, ROUTINE W REFLEX MICROSCOPIC
Bilirubin Urine: NEGATIVE
Glucose, UA: NEGATIVE mg/dL
Hgb urine dipstick: NEGATIVE
Ketones, ur: NEGATIVE mg/dL
Leukocytes,Ua: NEGATIVE
Nitrite: NEGATIVE
Protein, ur: NEGATIVE mg/dL
Specific Gravity, Urine: 1.001 — ABNORMAL LOW (ref 1.005–1.030)
pH: 7 (ref 5.0–8.0)

## 2023-11-13 LAB — SEDIMENTATION RATE: Sed Rate: 3 mm/h (ref 0–20)

## 2023-11-13 MED ORDER — AMOXICILLIN-POT CLAVULANATE 875-125 MG PO TABS: 1.0000 | ORAL_TABLET | Freq: Two times a day (BID) | ORAL | 0 refills | Status: AC

## 2023-11-13 MED ORDER — AMOXICILLIN-POT CLAVULANATE 875-125 MG PO TABS
1.0000 | ORAL_TABLET | Freq: Once | ORAL | Status: AC
  Administered 2023-11-13: 1 via ORAL
  Filled 2023-11-13: qty 1

## 2023-11-13 NOTE — ED Provider Notes (Signed)
 [facility] Provider Note    Event Date/Time   First MD Initiated Contact with Patient 11/13/23 1520     (approximate)   History   Weakness   HPI  Shane Dominguez is a 63 y.o. male  hypertension, hepatitis C, tobacco use, and GERD      For about 2 weeks has been feeling a little bit fatigued he has been experiencing a pressure-like feeling around his left face and slightly in front of his left ear.  Associates a feeling of a pressure like a sinus discomfort with some sinus drainage and feeling like it is draining down into the back of his throat.  He does report that his left eye in the area around his left eyelid was fairly swollen when this started 2 weeks ago but it seems of gotten better.  The swelling is gone away.  He saw his eye doctor and reports they did full eye examination and started him on azithromycin for sinus infection.  He does report he started taking some Vicks nasal spray and it is starting to feel like it is clearing up  No fever no chest pain.  Feels just fatigued.  Still walking still eating and drinking well.  No pain anywhere except for a pressure-like feeling around the left sinus, denies new vision problem no eye pain.  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  Physical Exam   Triage Vital Signs: ED Triage Vitals  Encounter Vitals Group     BP 11/13/23 1250 (!) 153/80     Girls Systolic BP Percentile --      Girls Diastolic BP Percentile --      Boys Systolic BP Percentile --      Boys Diastolic BP Percentile --      Pulse Rate 11/13/23 1250 87     Resp 11/13/23 1250 18     Temp 11/13/23 1250 97.6 F (36.4 C)     Temp src --      SpO2 11/13/23 1250 100 %     Weight 11/13/23 1251 130 lb (59 kg)     Height 11/13/23 1251 6' 1 (1.854 m)     Head Circumference --      Peak Flow --      Pain Score 11/13/23 1250 1     Pain Loc --      Pain  Education --      Exclude from Growth Chart --     Most recent vital signs: Vitals:   11/13/23 1250  BP: (!) 153/80  Pulse: 87  Resp: 18  Temp: 97.6 F (36.4 C)  SpO2: 100%     General: Awake, no distress.  Left eye exotropia.  Conjunctiva appear normal.  No drainage.  Right eye follows examiner without difficulty CV:  Good peripheral perfusion.  Normal tones and rate Resp:  Normal effort.  Clear bilateral with normal work of breathing denies cough. Abd:  No distention.  Soft nontender nondistended Other:  Mucous membranes moist.  Ambulates down the hallway into the treatment room without difficulty gets himself up onto treatment bed without difficulty.  Fully alert and oriented  No notable temporal artery tenderness bilateral.  Normal temporal artery pulsations bilateral.  Does relate some of the area of discomfort or pressure-like feeling to be over the left temporal scalp however   ED Results / Procedures / Treatments  Labs (all labs ordered are listed, but only abnormal results are displayed) Labs Reviewed  COMPREHENSIVE METABOLIC PANEL WITH GFR - Abnormal; Notable for the following components:      Result Value   Sodium 132 (*)    Glucose, Bld 115 (*)    Calcium 8.7 (*)    All other components within normal limits  URINALYSIS, ROUTINE W REFLEX MICROSCOPIC - Abnormal; Notable for the following components:   Color, Urine COLORLESS (*)    APPearance CLEAR (*)    Specific Gravity, Urine 1.001 (*)    All other components within normal limits  RESP PANEL BY RT-PCR (RSV, FLU A&B, COVID)  [id number]  CBC  SEDIMENTATION RATE   Labs reassuring normal CBC and comprehensive metabolic panel with exception of very mild hyponatremia  Urinalysis normal  I do not think he would need acute treatment, but COVID and flu swab sent to evaluate if he may be recovering from a viral illness    CT Maxillofacial Wo Contrast Result Date: 11/13/2023 CLINICAL DATA:  Sinus pressure, left eye  drainage EXAM: CT MAXILLOFACIAL WITHOUT CONTRAST TECHNIQUE: Multidetector CT imaging of the maxillofacial structures was performed. Multiplanar CT image reconstructions were also generated. RADIATION DOSE REDUCTION: This exam was performed according to the departmental dose-optimization program which includes automated exposure control, adjustment of the mA and/or kV according to patient size and/or use of iterative reconstruction technique. COMPARISON:  None Available. FINDINGS: Osseous: No fracture or mandibular dislocation. No destructive process. Orbits: There is preseptal edema within the left orbit, with an enlarged lacrimal gland in the superolateral aspect of the left orbit, consistent with preseptal cellulitis and dacryoadenitis. Slight ptosis of the left globe, which is otherwise unremarkable in appearance. The right orbit is unremarkable. Sinuses: Polypoid mucosal thickening within the maxillary sinuses, right greater than left. The remaining paranasal sinuses are clear. Soft tissues: Unremarkable. Limited intracranial: No significant or unexpected finding. IMPRESSION: 1. Findings consistent with left periorbital cellulitis and dacryoadenitis. Slight ptosis of the left lobe, which is otherwise unremarkable in appearance. 2. Polypoid mucosal thickening of the maxillary sinuses, right greater than left. Electronically Signed   By: Ozell Daring M.D.   On: 11/13/2023 16:33   CT Head Wo Contrast Result Date: 11/13/2023 CLINICAL DATA:  Weakness for 2 weeks, sinus pressure EXAM: CT HEAD WITHOUT CONTRAST TECHNIQUE: Contiguous axial images were obtained from the base of the skull through the vertex without intravenous contrast. RADIATION DOSE REDUCTION: This exam was performed according to the departmental dose-optimization program which includes automated exposure control, adjustment of the mA and/or kV according to patient size and/or use of iterative reconstruction technique. COMPARISON:  None Available.  FINDINGS: Brain: Confluent hypodensities throughout the periventricular white matter most consistent with chronic small vessel ischemic changes. No other signs of acute infarct or hemorrhage. The lateral ventricles and midline structures are unremarkable. No acute extra-axial fluid collections. No mass effect. Vascular: No hyperdense vessel or unexpected calcification. Skull: Normal. Negative for fracture or focal lesion. Sinuses/Orbits: There is increased soft tissue density within the superolateral aspect of the left orbit in the region of the lacrimal gland, consistent with dacryoadenitis. There is also soft tissue swelling in the preseptal soft tissues of the left orbit and left eyelid, consistent with periorbital cellulitis or blepharitis. There is mild ptosis of the left globe. Right globe and orbit are unremarkable. Mild polypoid mucosal thickening within the maxillary sinuses. Other: None. IMPRESSION: 1. Preseptal soft tissue swelling within the left orbit, as well as enlargement of the  lacrimal gland within the superolateral aspect of the left orbit, consistent with periorbital cellulitis and dacryoadenitis. Mild ptosis of the left globe, which is otherwise unremarkable in appearance. Please correlate with physical exam findings. 2. Confluent white matter hypodensities most consistent with chronic small vessel ischemic changes. 3. No acute infarct or hemorrhage. Electronically Signed   By: Ozell Daring M.D.   On: 11/13/2023 16:30   DG Chest 2 View Result Date: 11/13/2023 CLINICAL DATA:  Fatigue x2 weeks. EXAM: CHEST - 2 VIEW COMPARISON:  June 19, 2019 FINDINGS: The heart size and mediastinal contours are within normal limits. Moderate to marked severity calcification of the aortic arch is noted. Both lungs are clear. A chronic fracture deformity is seen involving the mid left clavicle. The visualized skeletal structures are otherwise unremarkable. IMPRESSION: No active cardiopulmonary disease.  Electronically Signed   By: Suzen Dials M.D.   On: 11/13/2023 16:13      PROCEDURES:  Critical Care performed: No  Procedures   MEDICATIONS ORDERED IN ED: Medications  amoxicillin -clavulanate (AUGMENTIN ) 875-125 MG per tablet 1 tablet (has no administration in time range)     IMPRESSION / MDM / ASSESSMENT AND PLAN / ED COURSE  I reviewed the triage vital signs and the nursing notes.                              Differential diagnosis includes, but is not limited to, possible sinusitis, postviral, rule out lingering infection such as a persistent sinusitis, also evaluate for temporal arteritis though I feel this is fairly low risk and a low ESR would exclude, other more subtle or insidious intracranial abnormalities like neoplasm mass lesion etc. would also be considered given the sinus like pressure and left-sided temporal discomfort.  He is awake alert well-oriented afebrile normal white count.  No meningismus normal neck.  Appears well-hydrated.    Patient's presentation is most consistent with acute complicated illness / injury requiring diagnostic workup.      Clinical Course as of 11/13/23 1658  Mon Nov 13, 2023  1642 Paged Dr. Enola.  I center called me back, he will call me back shortly for further [MQ]    Clinical Course User Index [MQ] Dicky Anes, MD   ----------------------------------------- 4:57 PM on 11/13/2023 ----------------------------------------- Or imaging shows findings consistent with a left preseptal cellulitic type pattern and dacryocystitis.  Reviewed findings and discussed case with Dr. Enola of the [facility].  He recommends Augmentin  twice daily for 7+ days.  I discussed with the patient he is comfortable with this plan and will fill his prescription.  Recommend he follow-up with the eye center for reassessment in 2 to 3 days, patient reports that he will likely follow-up with Carlin Blamer which is his PCP in a couple days and status  would be better convenience.  I did encourage him to set up follow-up the [facility], but we did discuss careful return precautions.  Either way he has a plan that he will follow-up with physician for reassessment in the next few days, initiate Augmentin .  Careful return precautions advised which she is agreeable with.  Return precautions and treatment recommendations and follow-up discussed with the patient who is agreeable with the plan.   FINAL CLINICAL IMPRESSION(S) / ED DIAGNOSES   Final diagnoses:  Preseptal cellulitis of left eye     Rx / DC Orders   ED Discharge Orders  Ordered    amoxicillin -clavulanate (AUGMENTIN ) 875-125 MG tablet  2 times daily        11/13/23 1656             Note:  This document was prepared using Dragon voice recognition software and may include unintentional dictation errors.   Dicky Anes, MD 11/13/23 [phone]

## 2023-11-13 NOTE — Discharge Instructions (Addendum)
 Call [facility] and schedule a clinic appointment in the next 1 to 3 days to follow-up with Dr. Enola.  He will be in the clinic this week and would like to see you for follow-up to make certain your symptoms are improving and that there is no further worsening.  Return to the ER right away if you develop a severe headache, high fevers, significant swelling or change in vision, or other concerns arise.

## 2023-11-13 NOTE — ED Triage Notes (Signed)
 Pt complains of weakness x 2 weeks. Went to eye doctor for sinus pressure and was given antibiotic for drainage from left eye. Pt has lazy eye and eye droop is normal for pt. Pt states feels like energy has been taken from him. Denies any vision problems and states still has sinus pressure on left side of head. Pt completed antibiotic.

## 2023-12-29 ENCOUNTER — Ambulatory Visit: Attending: Internal Medicine | Admitting: Internal Medicine

## 2023-12-29 ENCOUNTER — Encounter: Payer: Self-pay | Admitting: Internal Medicine

## 2023-12-29 VITALS — BP 168/90 | HR 94 | Ht 73.0 in | Wt 147.6 lb

## 2023-12-29 DIAGNOSIS — I10 Essential (primary) hypertension: Secondary | ICD-10-CM | POA: Diagnosis not present

## 2023-12-29 DIAGNOSIS — Z720 Tobacco use: Secondary | ICD-10-CM | POA: Insufficient documentation

## 2023-12-29 MED ORDER — AMLODIPINE BESYLATE 10 MG PO TABS: 10.0000 mg | ORAL_TABLET | Freq: Every day | ORAL | 3 refills | Status: AC

## 2023-12-29 NOTE — Patient Instructions (Signed)
 Medication Instructions:  Your physician recommends the following medication changes.  RESTART TAKING: Amlodipine  10 mg by mouth daily    *If you need a refill on your cardiac medications before your next appointment, please call your pharmacy*  Lab Work: No labs ordered today    Testing/Procedures: No test ordered today   Follow-Up: At [facility], you and your health needs are our priority.  As part of our continuing mission to provide you with exceptional heart care, our providers are all part of one team.  This team includes your primary Cardiologist (physician) and Advanced Practice Providers or APPs (Physician Assistants and Nurse Practitioners) who all work together to provide you with the care you need, when you need it.  Your next appointment:   2 month(s)  Provider:   You may see Lonni Hanson, MD or one of the following Advanced Practice Providers on your designated Care Team:   Lonni Meager, NP Lesley Maffucci, PA-C Bernardino Bring, PA-C Cadence [employer] [employer], PA-C Tylene Lunch, NP Barnie Hila, NP

## 2023-12-29 NOTE — Progress Notes (Signed)
 Cardiology Office Note:  .   Date:  12/30/2023  ID:  Shane Dominguez, DOB [birth year], MRN [id number] PCP: Patient, No Pcp Per  [facility] Providers Cardiologist:  Lonni Hanson, MD     History of Present Illness: .   Shane Dominguez is a 63 y.o. male hypertension, hepatitis C, GERD, and tobacco use, who presents for follow-up of hypertension.  He was seen in our office in February by Bernardino Bring, PA, at which time he was feeling well.  He was still smoking half a pack per day and was not monitoring his blood pressure at home or limiting his sodium intake.  He was compliant with amlodipine  10 mg daily.  Blood pressure was moderately elevated at that time.  Carvedilol  6.25 mg twice daily was added.  He was seen in follow-up by Lesley Maffucci, PA, in May, at which time his blood pressure was better but still elevated.  On further questioning, it turns out that he had never started carvedilol .  He presented to the ER in early September with facial pain and swelling.  He was diagnosed with left preseptal cellulitis and dacryocystitis.  He was prescribed Augmentin  and instructed to follow-up with [facility] and [facility].  Today, Shane Dominguez reports that he has been feeling well.  He reports that he has been eating more salt lately and also ran out of amlodipine  about 10 days ago.  He is also dealing with continued left-sided sinus/ear issues for which she is scheduled to follow-up in the [facility] next month.  He denies chest pain, shortness of breath, palpitations, lightheadedness, and edema.  He is back to smoking 1 pack/day.  ROS: See HPI  Studies Reviewed: SABRA   EKG Interpretation Date/Time:  Friday December 29 2023 15:33:34 EDT Ventricular Rate:  94 PR Interval:  186 QRS Duration:  94 QT Interval:  340 QTC Calculation: 425 R Axis:   -27  Text Interpretation: Normal sinus rhythm Minimal voltage criteria for LVH, may be normal variant Borderline ECG When  compared with ECG of 17-Feb-2023 09:43, No significant change was found Confirmed by Sohil Timko, Lonni [phone]) on 12/29/2023 3:57:13 PM    Risk Assessment/Calculations:         Physical Exam:   VS:  BP (!) 168/90   Pulse 94   Ht 6' 1 (1.854 m)   Wt 147 lb 9.6 oz (67 kg)   SpO2 98%   BMI 19.47 kg/m    Wt Readings from Last 3 Encounters:  12/29/23 147 lb 9.6 oz (67 kg)  11/13/23 130 lb (59 kg)  07/21/23 142 lb 3.2 oz (64.5 kg)    General:  NAD. Neck: No JVD or HJR. Lungs: Mildly diminished breath sounds throughout without wheezes or crackles. Heart: Regular rate and rhythm without murmurs, rubs, or gallops. Abdomen: Soft, nontender, nondistended. Extremities: No lower extremity edema.  ASSESSMENT AND PLAN: .    Uncontrolled hypertension: Unfortunately, Shane Dominguez has struggled with good blood pressure control for many years, in part due to suboptimal compliance with low-sodium restriction and antihypertensive medications.  He remains reluctant to add a second agent.  We will therefore send in a new prescription for amlodipine  10 mg daily.  I have asked him to monitor his blood pressure at home and to let us  know if it is consistently above 140/90 after resuming amlodipine  and limiting his sodium intake.  Tobacco use: Cigarette use has increased to 1 pack/day.  Smoking cessation encouraged.  He  is currently in the contemplative state.    Dispo: Return to clinic in 2 months.  Signed, Lonni Hanson, MD

## 2023-12-30 ENCOUNTER — Encounter: Payer: Self-pay | Admitting: Internal Medicine

## 2024-01-18 ENCOUNTER — Ambulatory Visit
Admission: EM | Admit: 2024-01-18 | Discharge: 2024-01-18 | Disposition: A | Discharge status: Home / Self Care | Attending: Emergency Medicine | Admitting: Emergency Medicine

## 2024-01-18 DIAGNOSIS — L03213 Periorbital cellulitis: Secondary | ICD-10-CM

## 2024-01-18 MED ORDER — SULFAMETHOXAZOLE-TRIMETHOPRIM 800-160 MG PO TABS: 1.0000 | ORAL_TABLET | Freq: Two times a day (BID) | ORAL | 0 refills | Status: AC

## 2024-01-18 NOTE — Discharge Instructions (Signed)
 Start taking Bactrim twice daily for 7 days for preseptal cellulitis coverage. I have attached an ophthalmologist that you can call and schedule an appointment with for further evaluation if your symptoms continue related to this. Otherwise follow-up with your primary care provider or return here as needed.

## 2024-01-18 NOTE — ED Triage Notes (Signed)
 Patient states that his left eye lid is lowering and left side of face is swelling for 3 weeks went to ED. And was given abx that that helped for a min. No changes in vision

## 2024-01-18 NOTE — ED Provider Notes (Signed)
 [facility]    CSN: [id number] Arrival date & time: 01/18/24  1039      History   Chief Complaint Chief Complaint  Patient presents with   Eye Problem   Facial Pain    HPI Shane Dominguez is a 63 y.o. male.   Patient presents with continued left upper eyelid swelling and drooping after being seen in the emergency department on 9/8 and prescribed antibiotics for preseptal cellulitis.  Patient reports that he finished the antibiotics as prescribed but continued to have swelling, pain, and drooping to his left upper eyelid.    Patient does report a history of lazy eye to this eye, but states that his eyelid is usually normal in appearance.  Patient denies any drainage from this area.  Patient denies any fever, body aches, or chills.  Patient also denies any visual disturbances, eye redness, or eye pain.  Family reports that patient has previously seen Dr. Carolee at [facility], but is requesting information for a different ophthalmologist.  The history is provided by the patient and medical records.  Eye Problem   Past Medical History:  Diagnosis Date   Acute appendicitis    Appendicitis 08/18/2016   GERD (gastroesophageal reflux disease)    NO MEDS   Hepatitis C    Hypertension    Tobacco abuse     Patient Active Problem List   Diagnosis Date Noted   Tobacco use 11/02/2019   Atypical chest pain 12/20/2017   Uncontrolled hypertension    Bilateral inguinal hernia without obstruction or gangrene 09/01/2016   Gastroesophageal reflux disease 09/01/2016   Appendicitis 08/18/2016   Acute appendicitis     Past Surgical History:  Procedure Laterality Date   HERNIA REPAIR  11/18/2016   INGUINAL HERNIA REPAIR Bilateral 11/17/2016   Procedure: LAPAROSCOPIC BILATERAL INGUINAL HERNIA REPAIR;  Surgeon: Jordis Laneta FALCON, MD;  Location: [facility] ORS;  Service: General;  Laterality: Bilateral;   INSERTION OF MESH Bilateral 11/17/2016   Procedure: INSERTION OF MESH;  Surgeon:  Jordis Laneta FALCON, MD;  Location: [facility] ORS;  Service: General;  Laterality: Bilateral;   LAPAROSCOPIC APPENDECTOMY N/A 08/18/2016   Procedure: APPENDECTOMY LAPAROSCOPIC;  Surgeon: Jordis Laneta FALCON, MD;  Location: [facility] ORS;  Service: General;  Laterality: N/A;       Home Medications    Prior to Admission medications   Medication Sig Start Date End Date Taking? Authorizing Provider  amLODipine  (NORVASC ) 10 MG tablet Take 1 tablet (10 mg total) by mouth daily. 12/29/23  Yes End, Lonni, MD  sulfamethoxazole-trimethoprim (BACTRIM DS) 800-160 MG tablet Take 1 tablet by mouth 2 (two) times daily for 7 days. 01/18/24 01/25/24 Yes Johnie Flaming A, NP  Multiple Vitamin (MULTIVITAMIN) tablet Take 1 tablet by mouth daily.     [provider]  polyethylene glycol (MIRALAX ) 17 g packet Take 17 g by mouth daily as needed (for constipation). 02/17/23   Abigail Bernardino HERO, PA-C    Family History Family History  Problem Relation Age of Onset   Stroke Mother    COPD Father    Emphysema Father    Heart attack Brother    Stroke Brother     Social History Social History   Tobacco Use   Smoking status: Every Day    Current packs/day: 0.50    Average packs/day: 0.5 packs/day for 40.0 years (20.0 ttl pk-yrs)    Types: Cigarettes   Smokeless tobacco: Never   Tobacco comments:    10/21/2020 Quit smoking 30 days ago  04/28/2021 1/2 PPD  Vaping Use   Vaping status: Never Used  Substance Use Topics   Alcohol use: Not Currently   Drug use: No     Allergies   Patient has no known allergies.   Review of Systems Review of Systems  Per HPI  Physical Exam Triage Vital Signs ED Triage Vitals  Encounter Vitals Group     BP 01/18/24 1159 (!) 154/92     Girls Systolic BP Percentile --      Girls Diastolic BP Percentile --      Boys Systolic BP Percentile --      Boys Diastolic BP Percentile --      Pulse Rate 01/18/24 1159 83     Resp 01/18/24 1159 17     Temp 01/18/24 1159 98.8  F (37.1 C)     Temp Source 01/18/24 1159 Oral     SpO2 01/18/24 1159 100 %     Weight 01/18/24 1158 135 lb (61.2 kg)     Height --      Head Circumference --      Peak Flow --      Pain Score 01/18/24 1157 8     Pain Loc --      Pain Education --      Exclude from Growth Chart --    No data found.  Updated Vital Signs BP (!) 154/92 (BP Location: Left Arm)   Pulse 83   Temp 98.8 F (37.1 C) (Oral)   Resp 17   Wt 135 lb (61.2 kg)   SpO2 100%   BMI 17.81 kg/m   Visual Acuity Right Eye Distance:   Left Eye Distance:   Bilateral Distance:    Right Eye Near:   Left Eye Near:    Bilateral Near:     Physical Exam Vitals and nursing note reviewed.  Constitutional:      General: He is awake. He is not in acute distress.    Appearance: Normal appearance. He is well-developed and well-groomed. He is not ill-appearing.  Eyes:     General:        Left eye: No foreign body, discharge or hordeolum.     Conjunctiva/sclera:     Left eye: Left conjunctiva is not injected. No chemosis, exudate or hemorrhage.    Comments: Swelling and drooping noted to left upper eyelid  Skin:    General: Skin is warm and dry.  Neurological:     Mental Status: He is alert.  Psychiatric:        Behavior: Behavior is cooperative.      UC Treatments / Results  Labs (all labs ordered are listed, but only abnormal results are displayed) Labs Reviewed - No data to display  EKG   Radiology No results found.  Procedures Procedures (including critical care time)  Medications Ordered in UC Medications - No data to display  Initial Impression / Assessment and Plan / UC Course  I have reviewed the triage vital signs and the nursing notes.  Pertinent labs & imaging results that were available during my care of the patient were reviewed by me and considered in my medical decision making (see chart for details).     Patient is overall well-appearing.  Vitals are stable.  Exam findings  concerning for continued preseptal cellulitis.  Prescribed Bactrim for coverage of this.  Given information for ophthalmology to follow-up with.  Discussed follow-up and return precautions. Final Clinical Impressions(s) / UC Diagnoses   Final diagnoses:  Preseptal cellulitis of left upper eyelid     Discharge Instructions      Start taking Bactrim twice daily for 7 days for preseptal cellulitis coverage. I have attached an ophthalmologist that you can call and schedule an appointment with for further evaluation if your symptoms continue related to this. Otherwise follow-up with your primary care provider or return here as needed.    ED Prescriptions     Medication Sig Dispense Auth. Provider   sulfamethoxazole-trimethoprim (BACTRIM DS) 800-160 MG tablet Take 1 tablet by mouth 2 (two) times daily for 7 days. 14 tablet Johnie Flaming A, NP      PDMP not reviewed this encounter.   Johnie Flaming A, NP 01/18/24 1247

## 2024-03-02 NOTE — Progress Notes (Unsigned)
 "  Cardiology Clinic Note   Date: 03/05/2024 ID: Shane Dominguez, DOB Dec 14, 1960, MRN [id number]  Primary Cardiologist:  Lonni Hanson, MD  Chief Complaint   Shane Dominguez is a 63 y.o. male who presents to the clinic today for routine follow up.   Patient Profile   Shane Dominguez is followed by Dr. Hanson for the history outlined below.      Past medical history significant for: Hypertension. GERD. Hepatitis C. Tobacco abuse.  In summary, patient underwent nuclear stress test in October 2019 showing a small in size, severe, fixed basal and mid inferior septal defect consistent with scar but could not rule out artifact, no significant ischemia.  Study was felt to be low risk.  Echo at that time showed EF 60 to 65%, no RWMA, normal diastolic parameters, mild MR, normal RV size/function.  He has been followed by cardiology over the years for uncontrolled hypertension in the setting of medication nonadherence and excessive sodium intake.  He was seen in the clinic in March 2024 and reported struggling with seasonal allergies.  He had been out of amlodipine  for approximately 3 weeks and had recently restarted the medicine 2 days prior to visit.  He was inconsistently checking BP at home noting SBP 140 mmHg at last check.  He was working on improving diet by minimizing sodium intake though he was eating out at times.HCTZ 12.5 mg daily was added.  He was seen in follow-up in May 2024 and reported not yet starting HCTZ.  BP at that time was 148/78 and it was recommended he get started on the medication.  In August 2024 he still had not started HCTZ and reported adherence to amlodipine .  He was smoking 1 pack/day and eating out at restaurants/takeout food on a regular basis.  BP at the time of his visit was 160/92.  He declined initiation of HCTZ or carvedilol  and indicated he wanted to work on lifestyle modification.  Upon follow-up in December 2024 he had no cardiac complaints.  He reported he was  not checking BP at home but did continue to eat out regularly approximately 3 days a week.  BP 156/83.  He declined escalation of pharmacotherapy.  He was seen in February 2025.  BP 160/92 at the time of his visit.  He had decreased his smoking and was down to half a pack a day.  He agreed to the addition of carvedilol  6.25 mg twice daily.  Upon follow-up in May 2025 BP remained elevated at 146/88.  He reported not starting carvedilol  as he felt it would not help.  He declined further escalation of pharmacotherapy in favor of trying lifestyle modification.  Patient was last seen in the office by Dr. Hanson on 12/29/2023 for routine follow-up.  He had no cardiac complaints.  He had been out of amlodipine  for 10 days.  He reported being back up to smoking 1 pack/day.  BP 168/90 at the time of his visit.  Prescription for amlodipine  was renewed.  He continued to decline hypertensive management with further escalation of pharmacotherapy.  He was instructed to contact the office if BP consistently > 140/90 after resuming amlodipine  and limiting sodium intake.     History of Present Illness    Today, patient is here alone. Patient denies shortness of breath, dyspnea on exertion, lower extremity edema, orthopnea or PND. No chest pain, pressure, or tightness. No palpitations.  He has been taking his medication daily. He is working on quitting smoking and  is down to 1/2 pack a day. He shares that his wife passed away a few weeks ago. He is working on figuring out his living situation.    ROS: All other systems reviewed and are otherwise negative except as noted in History of Present Illness.  EKGs/Labs Reviewed       EKG is not performed today.   11/13/2023: ALT 12; AST 21; BUN 13; Creatinine, Ser 0.75; Potassium 3.5; Sodium 132   11/13/2023: Hemoglobin 16.7; WBC 5.7   Physical Exam    VS:  BP 108/64 (BP Location: Left Arm, Patient Position: Sitting, Cuff Size: Normal)   Pulse 82   Ht 6' 1 (1.854 m)   Wt  144 lb (65.3 kg)   SpO2 99%   BMI 19.00 kg/m  , BMI Body mass index is 19 kg/m.  GEN: Well nourished, well developed, in no acute distress. Neck: No JVD or carotid bruits. Cardiac:  RRR.  No murmur. No rubs or gallops.   Respiratory:  Respirations regular and unlabored. Clear to auscultation without rales, wheezing or rhonchi. GI: Soft, nontender, nondistended. Extremities: Radials/DP/PT 2+ and equal bilaterally. No clubbing or cyanosis. No edema   Skin: Warm and dry, no rash. Neuro: Strength intact.  Assessment & Plan   Hypertension BP today 108/64. He denies lightheadedness, dizziness, presyncope or syncope. He is taking medications daily.  - Continue amlodipine   Tobacco abuse Patient is working on quitting smoking. He is down to 1/2 pack a day. He is congratulated on his efforts.  - Encouraged complete cessation.   Disposition: Return in 3 months or sooner as needed.          Signed, Barnie HERO. Curry Seefeldt, DNP, NP-C  "

## 2024-03-05 ENCOUNTER — Ambulatory Visit: Attending: Student | Admitting: Student

## 2024-03-05 ENCOUNTER — Encounter: Payer: Self-pay | Admitting: Student

## 2024-03-05 VITALS — BP 108/64 | HR 82 | Ht 73.0 in | Wt 144.0 lb

## 2024-03-05 DIAGNOSIS — Z720 Tobacco use: Secondary | ICD-10-CM | POA: Insufficient documentation

## 2024-03-05 DIAGNOSIS — I10 Essential (primary) hypertension: Secondary | ICD-10-CM | POA: Diagnosis not present

## 2024-03-05 NOTE — Patient Instructions (Signed)
 Medication Instructions:   Your physician recommends that you continue on your current medications as directed. Please refer to the Current Medication list given to you today.    *If you need a refill on your cardiac medications before your next appointment, please call your pharmacy*  Lab Work:  None ordered at this time   If you have labs (blood work) drawn today and your tests are completely normal, you will receive your results only by:  MyChart Message (if you have MyChart) OR  A paper copy in the mail If you have any lab test that is abnormal or we need to change your treatment, we will call you to review the results.  Testing/Procedures:  None ordered at this time   Referrals:  None ordered at this time   Follow-Up:  At [facility], you and your health needs are our priority.  As part of our continuing mission to provide you with exceptional heart care, our providers are all part of one team.  This team includes your primary Cardiologist (physician) and Advanced Practice Providers or APPs (Physician Assistants and Nurse Practitioners) who all work together to provide you with the care you need, when you need it.  Your next appointment:   3 month(s)  Provider:    Lonni Hanson, MD or Barnie Hila, NP    We recommend signing up for the patient portal called MyChart.  Sign up information is provided on this After Visit Summary.  MyChart is used to connect with patients for Virtual Visits (Telemedicine).  Patients are able to view lab/test results, encounter notes, upcoming appointments, etc.  Non-urgent messages can be sent to your provider as well.   To learn more about what you can do with MyChart, go to [email or web address].

## 2024-06-06 ENCOUNTER — Ambulatory Visit: Admitting: Student
